# Patient Record
Sex: Female | Born: 2000 | Race: White | Hispanic: No | Marital: Single | Smoking: Never smoker
Health system: Southern US, Community
[De-identification: ages and names within clinical notes are randomized; demographics above are authoritative.]

## PROBLEM LIST (undated history)

## (undated) ENCOUNTER — Emergency Department (HOSPITAL_BASED_OUTPATIENT_CLINIC_OR_DEPARTMENT_OTHER): Admission: EM | Payer: BC Managed Care – PPO

## (undated) DIAGNOSIS — N921 Excessive and frequent menstruation with irregular cycle: Secondary | ICD-10-CM

## (undated) DIAGNOSIS — N939 Abnormal uterine and vaginal bleeding, unspecified: Secondary | ICD-10-CM

## (undated) DIAGNOSIS — F419 Anxiety disorder, unspecified: Secondary | ICD-10-CM

## (undated) DIAGNOSIS — K219 Gastro-esophageal reflux disease without esophagitis: Secondary | ICD-10-CM

## (undated) DIAGNOSIS — G4489 Other headache syndrome: Secondary | ICD-10-CM

## (undated) DIAGNOSIS — M1710 Unilateral primary osteoarthritis, unspecified knee: Secondary | ICD-10-CM

## (undated) DIAGNOSIS — R55 Syncope and collapse: Secondary | ICD-10-CM

## (undated) DIAGNOSIS — L700 Acne vulgaris: Secondary | ICD-10-CM

## (undated) HISTORY — DX: Syncope and collapse: R55

## (undated) HISTORY — DX: Other headache syndrome: G44.89

## (undated) HISTORY — DX: Acne vulgaris: L70.0

## (undated) HISTORY — DX: Abnormal uterine and vaginal bleeding, unspecified: N93.9

## (undated) HISTORY — PX: NO PAST SURGERIES: SHX2092

## (undated) HISTORY — PX: OTHER SURGICAL HISTORY: SHX169

## (undated) HISTORY — DX: Excessive and frequent menstruation with irregular cycle: N92.1

## (undated) HISTORY — DX: Anxiety disorder, unspecified: F41.9

## (undated) HISTORY — PX: TRANSTHORACIC ECHOCARDIOGRAM: SHX275

## (undated) HISTORY — DX: Unilateral primary osteoarthritis, unspecified knee: M17.10

---
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
## 2019-01-16 ENCOUNTER — Ambulatory Visit: Payer: Self-pay | Admitting: Family Medicine

## 2019-01-30 ENCOUNTER — Other Ambulatory Visit: Payer: Self-pay

## 2019-01-30 ENCOUNTER — Ambulatory Visit (INDEPENDENT_AMBULATORY_CARE_PROVIDER_SITE_OTHER): Payer: BC Managed Care – PPO | Admitting: Family Medicine

## 2019-01-30 ENCOUNTER — Encounter: Payer: Self-pay | Admitting: Family Medicine

## 2019-01-30 VITALS — BP 123/83 | HR 97 | Temp 98.7°F | Resp 16 | Ht 66.0 in | Wt 126.6 lb

## 2019-01-30 DIAGNOSIS — L700 Acne vulgaris: Secondary | ICD-10-CM | POA: Diagnosis not present

## 2019-01-30 DIAGNOSIS — N921 Excessive and frequent menstruation with irregular cycle: Secondary | ICD-10-CM | POA: Diagnosis not present

## 2019-01-30 LAB — CBC
HCT: 43.8 % (ref 36.0–46.0)
Hemoglobin: 14.7 g/dL (ref 12.0–15.0)
MCHC: 33.6 g/dL (ref 30.0–36.0)
MCV: 92.9 fl (ref 78.0–100.0)
Platelets: 297 10*3/uL (ref 150.0–400.0)
RBC: 4.72 Mil/uL (ref 3.87–5.11)
RDW: 12.9 % (ref 11.5–14.6)
WBC: 6.2 10*3/uL (ref 4.5–10.5)

## 2019-01-30 LAB — POCT URINE PREGNANCY: Preg Test, Ur: NEGATIVE

## 2019-01-30 MED ORDER — DROSPIRENONE-ETHINYL ESTRADIOL 3-0.02 MG PO TABS: 1.0000 | ORAL_TABLET | Freq: Every day | ORAL | 3 refills | Status: DC

## 2019-01-30 NOTE — Progress Notes (Signed)
Office Note 01/30/2019  CC:  Chief Complaint  Patient presents with  . Establish Care    no previous PCP, discuss birth control   HPI:  Heidi Mccormick is a 18 y.o. female who is here to establish care and discuss abnormal menses. Patient's most recent primary MD: none Old records were not reviewed prior to or during today's visit.  Has irregular menstrual cycles since onset of menses years ago: highly variable intervals between menses.  Typical menstrual period symptoms with each bleeding episode. Uses tampons, describes bleeding as excessive and bad cramping---uses between 1 and 2 boxes of 18 tampons each menses. Takes ibup and uses heating pad.  She has never seen med provider for this in the past. Not sexually active. LMP 01/25/19. Has never been on ocp before. Has derm MD she sees for acne: has tried doxycycline and many topical meds and none have helped (no accutane trial).    Denies hx of thrombosis in 1st deg relatives. She does not smoke.  She does not exercise. She does not take any iron or other MVi or otc meds.  History reviewed. No pertinent past medical history.  History reviewed. No pertinent surgical history.  Family History  Problem Relation Age of Onset  . Miscarriages / [place] Mother   . Diabetes Father   . Heart attack Father   . Heart disease Father   . High Cholesterol Father   . High blood pressure Father   . Asthma Brother   . Diabetes Maternal Grandmother   . Heart disease Maternal Grandmother   . High Cholesterol Maternal Grandmother   . High blood pressure Maternal Grandmother   . Miscarriages / Stillbirths Maternal Grandmother   . Breast cancer Maternal Grandmother   . Cancer Maternal Grandfather   . COPD Maternal Grandfather   . High Cholesterol Maternal Grandfather   . High blood pressure Maternal Grandfather   . Kidney disease Maternal Grandfather   . Early death Paternal Grandfather   . Heart attack Paternal Grandfather   .  Heart disease Paternal Grandfather     Social History   Socioeconomic History  . Marital status: Single    Spouse name: Not on file  . Number of children: Not on file  . Years of education: Not on file  . Highest education level: Not on file  Occupational History  . Not on file  Social Needs  . Financial resource strain: Not on file  . Food insecurity    Worry: Not on file    Inability: Not on file  . Transportation needs    Medical: Not on file    Non-medical: Not on file  Tobacco Use  . Smoking status: Never Smoker  . Smokeless tobacco: Never Used  Substance and Sexual Activity  . Alcohol use: Never    Frequency: Never  . Drug use: Never  . Sexual activity: Never  Lifestyle  . Physical activity    Days per week: Not on file    Minutes per session: Not on file  . Stress: Not on file  Relationships  . Social Herbalist on phone: Not on file    Gets together: Not on file    Attends religious service: Not on file    Active member of club or organization: Not on file    Attends meetings of clubs or organizations: Not on file    Relationship status: Not on file  . Intimate partner violence    Fear of  current or ex partner: Not on file    Emotionally abused: Not on file    Physically abused: Not on file    Forced sexual activity: Not on file  Other Topics Concern  . Not on file  Social History Narrative   Single, no children.   Lives at home with parents as of 01/2019.   Educ: HS--[employer].   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   No T/A/Ds    MEDS: none  No Known Allergies  ROS Review of Systems  Constitutional: Negative for fatigue and fever.  HENT: Negative for congestion and sore throat.   Eyes: Negative for visual disturbance.  Respiratory: Negative for cough.   Cardiovascular: Negative for chest pain.  Gastrointestinal: Negative for abdominal pain and nausea.  Genitourinary: Positive for menstrual problem (see hpi). Negative for  dysuria and enuresis.  Musculoskeletal: Negative for back pain and joint swelling.  Skin: Negative for rash.  Neurological: Negative for weakness and headaches.  Hematological: Negative for adenopathy.    PE; Blood pressure 123/83, pulse 97, temperature 98.7 F (37.1 C), temperature source Temporal, resp. rate 16, height 5\' 6"  (1.676 m), weight 126 lb 9.6 oz (57.4 kg), last menstrual period 01/25/2019, SpO2 100 %. Body mass index is 20.43 kg/m.  Exam chaperoned by Emi HolesBrittanae Staton, CMA. Gen: Alert, well appearing.  Patient is oriented to person, place, time, and situation. AFFECT: pleasant, lucid thought and speech. FACE: some mild comedonal acne on forehad and jaw line.  No white heads or black heads or cysts. WUJ:WJXBENT:Eyes: no injection, icteris, swelling, or exudate.  EOMI, PERRLA. Mouth: lips without lesion/swelling.  Oral mucosa pink and moist. Oropharynx without erythema, exudate, or swelling.  Neck - No masses or thyromegaly or limitation in range of motion CV: RRR, no m/r/g.   LUNGS: CTA bilat, nonlabored resps, good aeration in all lung fields. EXT: no clubbing or cyanosis.  no edema.   Pertinent labs:   UPT today: neg  ASSESSMENT AND PLAN:   1) Metromenorrhagia: started Yaz today. Told pt to expect irregularity to be the norm still for a few cycles until things start to balance out. Checking CBC today to see if heavy menses has led to anemia. UPT neg today.    2) Acne vulgaris: has failed doxy and multiple topical treatments. Accutane was next step but she and her Derm MD felt like hormones would be the next best step. I agree, esp with her irregular and heavy menses.  An After Visit Summary was printed and given to the patient.  Return in about 3 months (around 05/02/2019) for f/u metromenorrhagia.  Signed:  Santiago BumpersPhil Briasia Flinders, MD           01/30/2019

## 2019-02-01 ENCOUNTER — Telehealth: Payer: Self-pay

## 2019-02-01 NOTE — Telephone Encounter (Signed)
MyChart message sent with results.  

## 2020-01-24 ENCOUNTER — Other Ambulatory Visit: Payer: Self-pay

## 2020-01-30 ENCOUNTER — Encounter: Payer: Self-pay | Admitting: Family Medicine

## 2020-03-29 ENCOUNTER — Other Ambulatory Visit: Payer: Self-pay

## 2020-04-04 ENCOUNTER — Other Ambulatory Visit: Payer: Self-pay

## 2020-04-04 ENCOUNTER — Ambulatory Visit (INDEPENDENT_AMBULATORY_CARE_PROVIDER_SITE_OTHER): Payer: BC Managed Care – PPO | Admitting: Family Medicine

## 2020-04-04 ENCOUNTER — Encounter: Payer: Self-pay | Admitting: Family Medicine

## 2020-04-04 VITALS — BP 130/79 | HR 98 | Temp 98.3°F | Resp 16 | Ht 66.75 in | Wt 135.2 lb

## 2020-04-04 DIAGNOSIS — F4010 Social phobia, unspecified: Secondary | ICD-10-CM

## 2020-04-04 DIAGNOSIS — F410 Panic disorder [episodic paroxysmal anxiety] without agoraphobia: Secondary | ICD-10-CM | POA: Diagnosis not present

## 2020-04-04 DIAGNOSIS — F320 Major depressive disorder, single episode, mild: Secondary | ICD-10-CM | POA: Diagnosis not present

## 2020-04-04 DIAGNOSIS — Z23 Encounter for immunization: Secondary | ICD-10-CM

## 2020-04-04 DIAGNOSIS — Z0000 Encounter for general adult medical examination without abnormal findings: Secondary | ICD-10-CM | POA: Diagnosis not present

## 2020-04-04 DIAGNOSIS — F411 Generalized anxiety disorder: Secondary | ICD-10-CM | POA: Diagnosis not present

## 2020-04-04 MED ORDER — FLUOXETINE HCL 20 MG PO CAPS: 20.0000 mg | ORAL_CAPSULE | Freq: Every day | ORAL | 0 refills | Status: DC

## 2020-04-04 MED ORDER — LORAZEPAM 0.5 MG PO TABS: ORAL_TABLET | ORAL | 0 refills | Status: DC

## 2020-04-04 NOTE — Addendum Note (Signed)
Addended by: Emi Holes D on: 04/04/2020 01:42 PM   Modules accepted: Orders

## 2020-04-04 NOTE — Progress Notes (Signed)
Office Note 04/04/2020  CC:  Chief Complaint  Patient presents with  . Annual Exam    pt is not fasting    HPI:  Heidi Mccormick is a 19 y.o. female who is here for annual health maintenannce exam. Dr. Aldona Bar is her GYN MD.  Now working at [employer]. Living at home w/parents.  Most recent switch to progesterone-only OCP has helped SIGNIFICANTly with metromenorrhagia.  Has had long term high anxiety level.  TWo weeks ago had panic attack--upper chest pain and central pressure, waxing and waning, happened 1-2 times a day for a week or two.  Feeling lots of jittering, sweats a lot.  +Palpitations.  PANIC peaks in 10-15 min then grad dissipates.   Sleep is impaired.  Endorses depressed mood for at least 2 wks daily in recent past. No SI or HI.  Poor concentration.  Very irritable.  Energy level poor. Work stress is very bad. Mild tension with parents.   Boyfriend and her not getting along.   No self medicating with alc/drugs. Still functioning/working. Endorces social anxiety and mild agoraphobia. No OCD tendencies.  Past Medical History:  Diagnosis Date  . Abnormal vaginal bleeding    Dr. Aldona Bar  . Acne vulgaris   . Menometrorrhagia    Started Yaz 01/30/19    History reviewed. No pertinent surgical history.  Family History  Problem Relation Age of Onset  . Miscarriages / [place] Mother   . Diabetes Father   . Heart attack Father   . Heart disease Father   . High Cholesterol Father   . High blood pressure Father   . Asthma Brother   . Diabetes Maternal Grandmother   . Heart disease Maternal Grandmother   . High Cholesterol Maternal Grandmother   . High blood pressure Maternal Grandmother   . Miscarriages / Stillbirths Maternal Grandmother   . Breast cancer Maternal Grandmother   . Cancer Maternal Grandfather   . COPD Maternal Grandfather   . High Cholesterol Maternal Grandfather   . High blood pressure Maternal Grandfather   . Kidney disease  Maternal Grandfather   . Early death Paternal Grandfather   . Heart attack Paternal Grandfather   . Heart disease Paternal Grandfather     Social History   Socioeconomic History  . Marital status: Single    Spouse name: Not on file  . Number of children: Not on file  . Years of education: Not on file  . Highest education level: Not on file  Occupational History  . Not on file  Tobacco Use  . Smoking status: Never Smoker  . Smokeless tobacco: Never Used  Substance and Sexual Activity  . Alcohol use: Never  . Drug use: Never  . Sexual activity: Never  Other Topics Concern  . Not on file  Social History Narrative   Single, no children.   Lives at home with parents as of 01/2019.   Educ: HS--[employer].   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   No T/A/Ds   Social Determinants of Health   Financial Resource Strain:   . Difficulty of Paying Living Expenses: Not on file  Food Insecurity:   . Worried About Programme researcher, broadcasting/film/video in the Last Year: Not on file  . Ran Out of Food in the Last Year: Not on file  Transportation Needs:   . Lack of Transportation (Medical): Not on file  . Lack of Transportation (Non-Medical): Not on file  Physical Activity:   . Days  of Exercise per Week: Not on file  . Minutes of Exercise per Session: Not on file  Stress:   . Feeling of Stress : Not on file  Social Connections:   . Frequency of Communication with Friends and Family: Not on file  . Frequency of Social Gatherings with Friends and Family: Not on file  . Attends Religious Services: Not on file  . Active Member of Clubs or Organizations: Not on file  . Attends Banker Meetings: Not on file  . Marital Status: Not on file  Intimate Partner Violence:   . Fear of Current or Ex-Partner: Not on file  . Emotionally Abused: Not on file  . Physically Abused: Not on file  . Sexually Abused: Not on file    Outpatient Medications Prior to Visit  Medication Sig Dispense  Refill  . Drospirenone (SLYND) 4 MG TABS Take 4 mg by mouth daily.    . drospirenone-ethinyl estradiol (YAZ) 3-0.02 MG tablet Take 1 tablet by mouth daily. 3 Package 3  . NORTREL 1/35, 21, tablet Take 1 tablet by mouth daily.     No facility-administered medications prior to visit.    No Known Allergies  ROS Review of Systems  Constitutional: Negative for appetite change, chills, fatigue and fever.  HENT: Negative for congestion, dental problem, ear pain and sore throat.   Eyes: Negative for discharge, redness and visual disturbance.  Respiratory: Negative for cough, chest tightness, shortness of breath and wheezing.   Cardiovascular: Negative for chest pain, palpitations and leg swelling.  Gastrointestinal: Negative for abdominal pain, blood in stool, diarrhea, nausea and vomiting.  Genitourinary: Negative for difficulty urinating, dysuria, flank pain, frequency, hematuria and urgency.  Musculoskeletal: Negative for arthralgias, back pain, joint swelling, myalgias and neck stiffness.  Skin: Negative for pallor and rash.  Neurological: Positive for dizziness and tremors. Negative for speech difficulty, weakness and headaches.  Hematological: Negative for adenopathy. Does not bruise/bleed easily.  Psychiatric/Behavioral: Positive for agitation, decreased concentration and dysphoric mood. Negative for confusion and sleep disturbance. The patient is not nervous/anxious.        Anxiety/panic    PE; Vitals with BMI 04/04/2020 01/30/2019  Height 5' 6.75" 5\' 6"   Weight 135 lbs 3 oz 126 lbs 10 oz  BMI 21.35 20.44  Systolic 130 123  Diastolic 79 83  Pulse 98 97  Exam chaperoned by , CMA.  Gen: Alert, well appearing.  Patient is oriented to person, place, time, and situation. AFFECT: pleasant, lucid thought and speech. ENT: Ears: EACs clear, normal epithelium.  TMs with good light reflex and landmarks bilaterally.  Eyes: no injection, icteris, swelling, or exudate.  EOMI,  PERRLA. Nose: no drainage or turbinate edema/swelling.  No injection or focal lesion.  Mouth: lips without lesion/swelling.  Oral mucosa pink and moist.  Dentition intact and without obvious caries or gingival swelling.  Oropharynx without erythema, exudate, or swelling.  Neck: supple/nontender.  No LAD, mass, or TM.  Carotid pulses 2+ bilaterally, without bruits. CV: RRR, no m/r/g.  Chest wall: mild TTP in LUSB region. LUNGS: CTA bilat, nonlabored resps, good aeration in all lung fields. ABD: soft, NT, ND, BS normal.  No hepatospenomegaly or mass.  No bruits. EXT: no clubbing, cyanosis, or edema.  Musculoskeletal: no joint swelling, erythema, warmth, or tenderness.  ROM of all joints intact. Skin - no sores or suspicious lesions or rashes or color changes Neuro: CN 2-12 intact bilaterally, strength 5/5 in proximal and distal upper extremities and lower extremities  bilaterally.  No sensory deficits.  No tremor.  No disdiadochokinesis.  No ataxia.  Upper extremity and lower extremity DTRs symmetric.  No pronator drift.   Pertinent labs:  No results found for: TSH Lab Results  Component Value Date   WBC 6.2 01/30/2019   HGB 14.7 01/30/2019   HCT 43.8 01/30/2019   MCV 92.9 01/30/2019   PLT 297.0 01/30/2019    ASSESSMENT AND PLAN:   1) GAD, panic attacks, social anxiety d/o, mild agoraphobia: Acute job stress has exacerbated all of this. Start fluoxetine 20mg  qd.  Also ativan 0.5mg , 1-2 bid prn, #30, no RF.  2) Health maintenance exam: Reviewed age and gender appropriate health maintenance issues (prudent diet, regular exercise, health risks of tobacco and excessive alcohol, use of seatbelts, fire alarms in home, use of sunscreen).  Also reviewed age and gender appropriate health screening as well as vaccine recommendations. Vaccines: Tdap->deferred for now.  Flu->given today. Covid->UTD. Labs: none today. Cervical ca screening: she will discuss with dr. .  An After Visit Summary  was printed and given to the patient.  FOLLOW UP:  Return in about 4 weeks (around 05/02/2020) for f/u anx/med.  Signed:  07/02/2020, MD           04/04/2020

## 2020-04-04 NOTE — Patient Instructions (Signed)
Health Maintenance, Female Adopting a healthy lifestyle and getting preventive care are important in promoting health and wellness. Ask your health care provider about:  The right schedule for you to have regular tests and exams.  Things you can do on your own to prevent diseases and keep yourself healthy. What should I know about diet, weight, and exercise? Eat a healthy diet   Eat a diet that includes plenty of vegetables, fruits, low-fat dairy products, and lean protein.  Do not eat a lot of foods that are high in solid fats, added sugars, or sodium. Maintain a healthy weight Body mass index (BMI) is used to identify weight problems. It estimates body fat based on height and weight. Your health care provider can help determine your BMI and help you achieve or maintain a healthy weight. Get regular exercise Get regular exercise. This is one of the most important things you can do for your health. Most adults should:  Exercise for at least 150 minutes each week. The exercise should increase your heart rate and make you sweat (moderate-intensity exercise).  Do strengthening exercises at least twice a week. This is in addition to the moderate-intensity exercise.  Spend less time sitting. Even light physical activity can be beneficial. Watch cholesterol and blood lipids Have your blood tested for lipids and cholesterol at 20 years of age, then have this test every 5 years. Have your cholesterol levels checked more often if:  Your lipid or cholesterol levels are high.  You are older than 21 years of age.  You are at high risk for heart disease. What should I know about cancer screening? Depending on your health history and family history, you may need to have cancer screening at various ages. This may include screening for:  Breast cancer.  Cervical cancer.  Colorectal cancer.  Skin cancer.  Lung cancer. What should I know about heart disease, diabetes, and high blood  pressure? Blood pressure and heart disease  High blood pressure causes heart disease and increases the risk of stroke. This is more likely to develop in people who have high blood pressure readings, are of African descent, or are overweight.  Have your blood pressure checked: ? Every 3-5 years if you are 18-39 years of age. ? Every year if you are 40 years old or older. Diabetes Have regular diabetes screenings. This checks your fasting blood sugar level. Have the screening done:  Once every three years after age 40 if you are at a normal weight and have a low risk for diabetes.  More often and at a younger age if you are overweight or have a high risk for diabetes. What should I know about preventing infection? Hepatitis B If you have a higher risk for hepatitis B, you should be screened for this virus. Talk with your health care provider to find out if you are at risk for hepatitis B infection. Hepatitis C Testing is recommended for:  Everyone born from 1945 through 1965.  Anyone with known risk factors for hepatitis C. Sexually transmitted infections (STIs)  Get screened for STIs, including gonorrhea and chlamydia, if: ? You are sexually active and are younger than 21 years of age. ? You are older than 21 years of age and your health care provider tells you that you are at risk for this type of infection. ? Your sexual activity has changed since you were last screened, and you are at increased risk for chlamydia or gonorrhea. Ask your health care provider if   you are at risk.  Ask your health care provider about whether you are at high risk for HIV. Your health care provider may recommend a prescription medicine to help prevent HIV infection. If you choose to take medicine to prevent HIV, you should first get tested for HIV. You should then be tested every 3 months for as long as you are taking the medicine. Pregnancy  If you are about to stop having your period (premenopausal) and  you may become pregnant, seek counseling before you get pregnant.  Take 400 to 800 micrograms (mcg) of folic acid every day if you become pregnant.  Ask for birth control (contraception) if you want to prevent pregnancy. Osteoporosis and menopause Osteoporosis is a disease in which the bones lose minerals and strength with aging. This can result in bone fractures. If you are 65 years old or older, or if you are at risk for osteoporosis and fractures, ask your health care provider if you should:  Be screened for bone loss.  Take a calcium or vitamin D supplement to lower your risk of fractures.  Be given hormone replacement therapy (HRT) to treat symptoms of menopause. Follow these instructions at home: Lifestyle  Do not use any products that contain nicotine or tobacco, such as cigarettes, e-cigarettes, and chewing tobacco. If you need help quitting, ask your health care provider.  Do not use street drugs.  Do not share needles.  Ask your health care provider for help if you need support or information about quitting drugs. Alcohol use  Do not drink alcohol if: ? Your health care provider tells you not to drink. ? You are pregnant, may be pregnant, or are planning to become pregnant.  If you drink alcohol: ? Limit how much you use to 0-1 drink a day. ? Limit intake if you are breastfeeding.  Be aware of how much alcohol is in your drink. In the [place] one drink equals one 12 oz bottle of beer (355 mL), one 5 oz glass of wine (148 mL), or one 1 oz glass of hard liquor (44 mL). General instructions  Schedule regular health, dental, and eye exams.  Stay current with your vaccines.  Tell your health care provider if: ? You often feel depressed. ? You have ever been abused or do not feel safe at home. Summary  Adopting a healthy lifestyle and getting preventive care are important in promoting health and wellness.  Follow your health care provider's instructions about healthy  diet, exercising, and getting tested or screened for diseases.  Follow your health care provider's instructions on monitoring your cholesterol and blood pressure. This information is not intended to replace advice given to you by your health care provider. Make sure you discuss any questions you have with your health care provider. Document Revised: 07/06/2018 Document Reviewed: 07/06/2018 Elsevier Patient Education  2020 Elsevier Inc.  

## 2020-04-26 ENCOUNTER — Other Ambulatory Visit: Payer: Self-pay | Admitting: Family Medicine

## 2020-05-02 ENCOUNTER — Ambulatory Visit: Payer: BC Managed Care – PPO | Admitting: Family Medicine

## 2020-05-03 ENCOUNTER — Other Ambulatory Visit: Payer: Self-pay | Admitting: Family Medicine

## 2020-05-27 ENCOUNTER — Other Ambulatory Visit: Payer: Self-pay | Admitting: Family Medicine

## 2020-06-03 ENCOUNTER — Other Ambulatory Visit: Payer: Self-pay | Admitting: Family Medicine

## 2020-06-06 ENCOUNTER — Ambulatory Visit (INDEPENDENT_AMBULATORY_CARE_PROVIDER_SITE_OTHER): Payer: BC Managed Care – PPO | Admitting: Family Medicine

## 2020-06-06 ENCOUNTER — Other Ambulatory Visit: Payer: Self-pay

## 2020-06-06 ENCOUNTER — Encounter: Payer: Self-pay | Admitting: Family Medicine

## 2020-06-06 VITALS — BP 107/72 | HR 96 | Temp 98.4°F | Resp 16 | Ht 66.75 in | Wt 134.4 lb

## 2020-06-06 DIAGNOSIS — F410 Panic disorder [episodic paroxysmal anxiety] without agoraphobia: Secondary | ICD-10-CM | POA: Diagnosis not present

## 2020-06-06 DIAGNOSIS — F411 Generalized anxiety disorder: Secondary | ICD-10-CM | POA: Diagnosis not present

## 2020-06-06 MED ORDER — FLUOXETINE HCL 20 MG PO CAPS
ORAL_CAPSULE | ORAL | 1 refills | Status: DC
Start: 2020-06-06 — End: 2021-05-19

## 2020-06-06 NOTE — Progress Notes (Signed)
OFFICE VISIT  06/06/2020  CC:  Chief Complaint  Patient presents with  . Follow-up    anxiety   HPI:    Patient is a 19 y.o.  female who presents for 2 mo f/u anx/dep. A/P as of last visit: "1) GAD, panic attacks, social anxiety d/o, mild agoraphobia: Acute job stress has exacerbated all of this. Start fluoxetine 20mg  qd.  Also ativan 0.5mg , 1-2 bid prn, #30, no RF.  2) Health maintenance exam: Reviewed age and gender appropriate health maintenance issues (prudent diet, regular exercise, health risks of tobacco and excessive alcohol, use of seatbelts, fire alarms in home, use of sunscreen).  Also reviewed age and gender appropriate health screening as well as vaccine recommendations. Vaccines: Tdap->deferred for now.  Flu->given today. Covid->UTD. Labs: none today. Cervical ca screening: she will discuss with dr. ."  INTERIM HX: Anxiety and mood A LOT better.  No med side effects. Sleep improved.  Appetite stable. Family relationships fine.  She ended up breaking up with the boyfriend she had a couple months ago, is happy with this.   Noted imp a couple weeks after starting fluox. Has used loraz only once, for panic attack, says it worked Aldona Bar.   PMP AWARE reviewed today: most recent rx for lorazepam 0.5mg  was filled 04/04/20, # 30, rx by me. No red flags.  Past Medical History:  Diagnosis Date  . Abnormal vaginal bleeding    Dr. 06/04/20  . Acne vulgaris   . Menometrorrhagia    Started Yaz 01/30/19    History reviewed. No pertinent surgical history.  Outpatient Medications Prior to Visit  Medication Sig Dispense Refill  . Drospirenone (SLYND) 4 MG TABS Take 4 mg by mouth daily.    04/02/19 LORazepam (ATIVAN) 0.5 MG tablet 1-2 tabs po bid prn severe anxiety 30 tablet 0  . FLUoxetine (PROZAC) 20 MG capsule TAKE 1 CAPSULE BY MOUTH EVERY DAY 30 capsule 0   No facility-administered medications prior to visit.    No Known Allergies  ROS As per HPI  PE: Vitals with  BMI 06/06/2020 04/04/2020 01/30/2019  Height 5' 6.75" 5' 6.75" 5\' 6"   Weight 134 lbs 6 oz 135 lbs 3 oz 126 lbs 10 oz  BMI 21.22 21.35 20.44  Systolic 107 130 04/02/2019  Diastolic 72 79 83  Pulse 96 98 97   Wt Readings from Last 2 Encounters:  06/06/20 134 lb 6.4 oz (61 kg)  04/04/20 135 lb 3.2 oz (61.3 kg)    Gen: alert, oriented x 4, affect pleasant.  Lucid thinking and conversation noted. HEENT: PERRLA, EOMI.   Neck: no LAD, mass, or thyromegaly. CV: RRR, no m/r/g LUNGS: CTA bilat, nonlabored. NEURO: no tremor or tics noted on observation.  Coordination intact. CN 2-12 grossly intact bilaterally, strength 5/5 in all extremeties.  No ataxia.   LABS:  none  IMPRESSION AND PLAN:  GAD with panic attacks.  She is 80% improved by her estimate. Cont fluox 20mg  qd, #90, RF x 1. Discussed plan of staying on this med for 9-12 mo minimum before considering wean off, but also reasonable to continue indefinitely with approp long term f/u. OK to continue to use lorazepam 0.5mg  prn---she has used it only once in 2 mo.  An After Visit Summary was printed and given to the patient.  FOLLOW UP: Return in about 4 months (around 10/04/2020) for f/u anx/dep.  Signed:  06/04/20, MD           06/06/2020

## 2020-11-25 LAB — HM PAP SMEAR
HM Pap smear: NORMAL
Pap: NEGATIVE

## 2020-11-25 LAB — OB RESULTS CONSOLE GC/CHLAMYDIA: Chlamydia: NEGATIVE

## 2020-11-25 LAB — RESULTS CONSOLE HPV: CHL HPV: NEGATIVE

## 2021-05-05 ENCOUNTER — Encounter: Payer: Self-pay | Admitting: Family Medicine

## 2021-05-05 ENCOUNTER — Ambulatory Visit (INDEPENDENT_AMBULATORY_CARE_PROVIDER_SITE_OTHER): Payer: BC Managed Care – PPO | Admitting: Family Medicine

## 2021-05-05 ENCOUNTER — Other Ambulatory Visit: Payer: Self-pay

## 2021-05-05 VITALS — BP 124/84 | HR 89 | Temp 98.4°F | Resp 16 | Ht 66.0 in | Wt 137.0 lb

## 2021-05-05 DIAGNOSIS — Z23 Encounter for immunization: Secondary | ICD-10-CM | POA: Diagnosis not present

## 2021-05-05 DIAGNOSIS — R55 Syncope and collapse: Secondary | ICD-10-CM | POA: Diagnosis not present

## 2021-05-05 DIAGNOSIS — N912 Amenorrhea, unspecified: Secondary | ICD-10-CM | POA: Diagnosis not present

## 2021-05-05 DIAGNOSIS — R519 Headache, unspecified: Secondary | ICD-10-CM

## 2021-05-05 DIAGNOSIS — Z0000 Encounter for general adult medical examination without abnormal findings: Secondary | ICD-10-CM | POA: Diagnosis not present

## 2021-05-05 DIAGNOSIS — F419 Anxiety disorder, unspecified: Secondary | ICD-10-CM

## 2021-05-05 LAB — POCT URINE PREGNANCY: Preg Test, Ur: NEGATIVE

## 2021-05-05 MED ORDER — RIZATRIPTAN BENZOATE 10 MG PO TABS: 10.0000 mg | ORAL_TABLET | ORAL | 0 refills | Status: DC | PRN

## 2021-05-05 NOTE — Progress Notes (Signed)
Office Note 05/05/2021  CC:  Chief Complaint  Patient presents with   Annual Exam    HPI:  Patient is a 20 y.o. female who is here for annual health maintenance exam and f/u GAD with panic attacks. I last saw her 06/06/20. A/P as of last visit: "GAD with panic attacks.  She is 80% improved by her estimate. Cont fluox 50m qd, #90, RF x 1. Discussed plan of staying on this med for 9-12 mo minimum before considering wean off, but also reasonable to continue indefinitely with approp long term f/u. OK to continue to use lorazepam 0.51mprn---she has used it only once in 2 mo."  INTERIM HX: She reports feeling very well. However, she then goes on to say that she has had 3 episodes of syncope in approximately the last month or 2.  Describes feeling like she starts to get anxious, tunnel vision, muffled hearing and then falls over and some friends usually have caught her before falling to the ground and injuring herself.  He does endorse complete loss of consciousness during these.  Only out very briefly.  1 episode occurred while getting blood, but that she does not note any preceding or triggering event on the others.  No palpitations. Reports a history of similar symptoms over the last few years but they usually are mild and never resulted in syncope and they are infrequent.  Denies vertigo. Also reports about a 3-week history of nearly constant headache.  Intensity waxes and wanes sometimes quite severe.  Location is retro-orbital and across forehead diffusely, only occasionally right retro-orbital only.  She does have occasional mild nausea without vomiting and photophobia.  No aura. Endorses chronic ringing in both ears, without hearing loss.  Says she is not having ringing today, though.  Has not had a menstrual period in approximately 3 to 4 months.  Has long history of irregular menses.  Says that her GYN recently prescribed 2 separate courses of progesterone to try to induce  withdrawal bleeding but these did not result in a bleeding.  She does take daily oral contraceptive but admits not completely regularly.  He is sexually active and has not taken any pregnancy test recently.  Her anxiety has not been an issue lately at all and she weaned herself off of her fluoxetine a couple of months ago.  She felt like these episodes of passing out were basically panic attacks that got real severe, but now she is not so sure.  PMP AWARE reviewed today: most recent rx for lorazepam was filled 04/04/20, # 30, rx by me. No red flags.   Past Medical History:  Diagnosis Date   Abnormal vaginal bleeding    Dr. WeStann Mainland Acne vulgaris    Menometrorrhagia    Started Yaz 01/30/19    History reviewed. No pertinent surgical history.  Family History  Problem Relation Age of Onset   Mi38 Stillbirths Mother    Diabetes Father    Heart attack Father    Heart disease Father    High Cholesterol Father    High blood pressure Father    Asthma Brother    Diabetes Maternal Grandmother    Heart disease Maternal Grandmother    High Cholesterol Maternal Grandmother    High blood pressure Maternal Grandmother    Miscarriages / Stillbirths Maternal Grandmother    Breast cancer Maternal Grandmother    Cancer Maternal Grandfather    COPD Maternal Grandfather    High Cholesterol Maternal Grandfather  High blood pressure Maternal Grandfather    Kidney disease Maternal Grandfather    Early death Paternal Grandfather    Heart attack Paternal Grandfather    Heart disease Paternal Grandfather     Social History   Socioeconomic History   Marital status: Single    Spouse name: Not on file   Number of children: Not on file   Years of education: Not on file   Highest education level: Not on file  Occupational History   Not on file  Tobacco Use   Smoking status: Never   Smokeless tobacco: Never  Substance and Sexual Activity   Alcohol use: Never   Drug use: Never   Sexual  activity: Never  Other Topics Concern   Not on file  Social History Narrative   Single, no children.   Lives at home with parents as of 01/2019.   Educ: HS--[employer].   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   No T/A/Ds   Social Determinants of Health   Financial Resource Strain: Not on file  Food Insecurity: Not on file  Transportation Needs: Not on file  Physical Activity: Not on file  Stress: Not on file  Social Connections: Not on file  Intimate Partner Violence: Not on file    Outpatient Medications Prior to Visit  Medication Sig Dispense Refill   Drospirenone (SLYND) 4 MG TABS Take 4 mg by mouth daily.     LORazepam (ATIVAN) 0.5 MG tablet 1-2 tabs po bid prn severe anxiety 30 tablet 0   FLUoxetine (PROZAC) 20 MG capsule TAKE 1 CAPSULE BY MOUTH EVERY DAY (Patient not taking: Reported on 05/05/2021) 90 capsule 1   No facility-administered medications prior to visit.    No Known Allergies  ROS Review of Systems  Constitutional:  Negative for appetite change, chills, fatigue and fever.  HENT:  Negative for congestion, dental problem, ear pain and sore throat.   Eyes:  Negative for discharge, redness and visual disturbance.  Respiratory:  Negative for cough, chest tightness, shortness of breath and wheezing.   Cardiovascular:  Negative for chest pain, palpitations and leg swelling.  Gastrointestinal:  Negative for abdominal pain, blood in stool, diarrhea, nausea and vomiting.  Endocrine: Negative for polydipsia, polyphagia and polyuria.  Genitourinary:  Negative for difficulty urinating, dysuria, flank pain, frequency, hematuria and urgency.  Musculoskeletal:  Negative for arthralgias, back pain, joint swelling, myalgias and neck stiffness.  Skin:  Negative for pallor and rash.  Neurological:  Positive for syncope and light-headedness. Negative for dizziness, speech difficulty, weakness and headaches.  Hematological:  Negative for adenopathy. Does not bruise/bleed  easily.  Psychiatric/Behavioral:  Negative for confusion and sleep disturbance. The patient is nervous/anxious.    PE; Vitals with BMI 05/05/2021 06/06/2020 04/04/2020  Height '5\' 6"'  5' 6.75" 5' 6.75"  Weight 137 lbs 134 lbs 6 oz 135 lbs 3 oz  BMI 22.12 56.38 93.73  Systolic 428 768 115  Diastolic 84 72 79  Pulse 89 96 98  Gen: Alert, well appearing.  Patient is oriented to person, place, time, and situation. AFFECT: pleasant, lucid thought and speech. ENT: Ears: EACs clear, normal epithelium.  TMs with good light reflex and landmarks bilaterally.  Eyes: no injection, icteris, swelling, or exudate.  EOMI, PERRLA. Nose: no drainage or turbinate edema/swelling.  No injection or focal lesion.  Mouth: lips without lesion/swelling.  Oral mucosa pink and moist.  Dentition intact and without obvious caries or gingival swelling.  Oropharynx without erythema, exudate, or swelling.  Neck: supple/nontender.  No LAD, mass, or TM.  Carotid pulses 2+ bilaterally, without bruits. CV: RRR, no m/r/g.   LUNGS: CTA bilat, nonlabored resps, good aeration in all lung fields. ABD: soft, NT, ND, BS normal.  No hepatospenomegaly or mass.  No bruits. EXT: no clubbing, cyanosis, or edema.  Musculoskeletal: no joint swelling, erythema, warmth, or tenderness.  ROM of all joints intact. Skin - no sores or suspicious lesions or rashes or color changes Neuro: CN 2-12 intact bilaterally, strength 5/5 in proximal and distal upper extremities and lower extremities bilaterally.  No sensory deficits.  No tremor.  No disdiadochokinesis.  No ataxia.  Upper extremity and lower extremity DTRs symmetric.  No pronator drift.   Pertinent labs:   Lab Results  Component Value Date   WBC 6.2 01/30/2019   HGB 14.7 01/30/2019   HCT 43.8 01/30/2019   MCV 92.9 01/30/2019   PLT 297.0 01/30/2019   POC UPT today: NEG  ASSESSMENT AND PLAN:   1) GAD, hx of panic attacks: general anxiety not an issue anymore. Has periods of inc  anxiety ? Panic.  Rare use of lorazepam.  2) syncope.  Sounds vasovagal.  Checking CBC, c-Met, and TSH today.  Given recent problem with headaches we will observe this closely.  3) new onset headaches: We will initiate treatment as migraines at this point. Discussed use of Maxalt 10 mg, may repeat dose in 2 hours if not significant improved.  Discussed possibility of starting daily migraine prevention medication in the near future, as well as possible neurology referral, particularly if things do not improve.  4) amenorrhea, history of irregular menses.  She is working this out with her GYN, but today we did do a pregnancy test and it was negative.  5) Health maintenance exam: Reviewed age and gender appropriate health maintenance issues (prudent diet, regular exercise, health risks of tobacco and excessive alcohol, use of seatbelts, fire alarms in home, use of sunscreen).  Also reviewed age and gender appropriate health screening as well as vaccine recommendations. Vaccines: Flu->given today.  Tdap->UTD. Labs: See #2 above. Cervical ca screening: PAP + HR HPV testing 11/25/20 at [facility] OG/[facility]N-->unknown result--no record available.  An After Visit Summary was printed and given to the patient.  FOLLOW UP:  Return in about 2 weeks (around 05/19/2021) for f/u HAs and syncope.  Signed:  Crissie Sickles, MD           05/05/2021

## 2021-05-06 ENCOUNTER — Encounter: Payer: Self-pay | Admitting: Family Medicine

## 2021-05-06 LAB — CBC WITH DIFFERENTIAL/PLATELET
Basophils Absolute: 0 10*3/uL (ref 0.0–0.1)
Basophils Relative: 0.5 % (ref 0.0–3.0)
Eosinophils Absolute: 0 10*3/uL (ref 0.0–0.7)
Eosinophils Relative: 0.4 % (ref 0.0–5.0)
HCT: 44.3 % (ref 36.0–46.0)
Hemoglobin: 14.6 g/dL (ref 12.0–15.0)
Lymphocytes Relative: 24.3 % (ref 12.0–46.0)
Lymphs Abs: 2.1 10*3/uL (ref 0.7–4.0)
MCHC: 32.9 g/dL (ref 30.0–36.0)
MCV: 91.9 fl (ref 78.0–100.0)
Monocytes Absolute: 0.7 10*3/uL (ref 0.1–1.0)
Monocytes Relative: 8.1 % (ref 3.0–12.0)
Neutro Abs: 5.9 10*3/uL (ref 1.4–7.7)
Neutrophils Relative %: 66.7 % (ref 43.0–77.0)
Platelets: 266 10*3/uL (ref 150.0–400.0)
RBC: 4.82 Mil/uL (ref 3.87–5.11)
RDW: 13.1 % (ref 11.5–15.5)
WBC: 8.8 10*3/uL (ref 4.0–10.5)

## 2021-05-06 LAB — COMPREHENSIVE METABOLIC PANEL
ALT: 13 U/L (ref 0–35)
AST: 18 U/L (ref 0–37)
Albumin: 4.7 g/dL (ref 3.5–5.2)
Alkaline Phosphatase: 64 U/L (ref 39–117)
BUN: 9 mg/dL (ref 6–23)
CO2: 24 mEq/L (ref 19–32)
Calcium: 9.8 mg/dL (ref 8.4–10.5)
Chloride: 102 mEq/L (ref 96–112)
Creatinine, Ser: 0.8 mg/dL (ref 0.40–1.20)
GFR: 104.69 mL/min (ref >60.00)
Glucose, Bld: 93 mg/dL (ref 70–99)
Potassium: 3.5 mEq/L (ref 3.5–5.1)
Sodium: 137 mEq/L (ref 135–145)
Total Bilirubin: 0.5 mg/dL (ref 0.2–1.2)
Total Protein: 7.8 g/dL (ref 6.0–8.3)

## 2021-05-06 LAB — TSH: TSH: 2.51 u[IU]/mL (ref 0.35–5.50)

## 2021-05-07 MED ORDER — RIZATRIPTAN BENZOATE 10 MG PO TABS: 10.0000 mg | ORAL_TABLET | ORAL | 0 refills | Status: DC | PRN

## 2021-05-19 ENCOUNTER — Ambulatory Visit (INDEPENDENT_AMBULATORY_CARE_PROVIDER_SITE_OTHER): Payer: BC Managed Care – PPO | Admitting: Family Medicine

## 2021-05-19 ENCOUNTER — Other Ambulatory Visit: Payer: Self-pay

## 2021-05-19 ENCOUNTER — Encounter: Payer: Self-pay | Admitting: Family Medicine

## 2021-05-19 VITALS — BP 128/84 | HR 95 | Temp 98.1°F | Ht 66.0 in | Wt 139.8 lb

## 2021-05-19 DIAGNOSIS — R519 Headache, unspecified: Secondary | ICD-10-CM

## 2021-05-19 MED ORDER — PROPRANOLOL HCL ER 80 MG PO CP24: 80.0000 mg | ORAL_CAPSULE | Freq: Every day | ORAL | 1 refills | Status: DC

## 2021-05-19 MED ORDER — IBUPROFEN 800 MG PO TABS
800.0000 mg | ORAL_TABLET | Freq: Two times a day (BID) | ORAL | 0 refills | Status: DC | PRN
  Filled 2021-09-23: qty 30, 15d supply, fill #0

## 2021-05-19 NOTE — Progress Notes (Signed)
OFFICE VISIT  05/19/2021  CC:  Chief Complaint  Patient presents with   Follow-up    Headaches, still taking meds prescribed but are not working well.     HPI:    Patient is a 20 y.o. female who presents for 2 wk f/u anxiety and syncope. A/P as of last visit: "1) GAD, hx of panic attacks: general anxiety not an issue anymore. Has periods of inc anxiety ? Panic.  Rare use of lorazepam.   2) syncope.  Sounds vasovagal.  Checking CBC, c-Met, and TSH today.  Given recent problem with headaches we will observe this closely.   3) new onset headaches: We will initiate treatment as migraines at this point. Discussed use of Maxalt 10 mg, may repeat dose in 2 hours if not significant improved.  Discussed possibility of starting daily migraine prevention medication in the near future, as well as possible neurology referral, particularly if things do not improve.   4) amenorrhea, history of irregular menses.  She is working this out with her GYN, but today we did do a pregnancy test and it was negative.   5) Health maintenance exam: Reviewed age and gender appropriate health maintenance issues (prudent diet, regular exercise, health risks of tobacco and excessive alcohol, use of seatbelts, fire alarms in home, use of sunscreen).  Also reviewed age and gender appropriate health screening as well as vaccine recommendations. Vaccines: Flu->given today.  Tdap->UTD. Labs: See #2 above. Cervical ca screening: PAP + HR HPV testing 11/25/20 at [facility]-->unknown result--no record available."  INTERIM HX: Heidi Mccormick reports having a chronic mild intensity headache since I saw her last, with breakthrough of severe retro-orbital headache on a pretty regular basis, nearly daily.  She has some nausea, photophobia, and phonophobia with these. Denies any double vision, loss of balance, loss of bowel or bladder control, or syncope. No dizziness or presyncope.  She has used the rizatriptan several times and  only on one occasion noted some improvement after taking a second dose.  No headaches that awaken her in the night.  No clear trigger for headache.  She has rarely used the lorazepam prn severe anxiety.  ROS as above, plus--> no fevers, no CP, no SOB, no wheezing, no cough, no rashes, no melena/hematochezia.  No polyuria or polydipsia.  No myalgias or arthralgias.  No focal weakness, paresthesias, or tremors.  No acute vision or hearing abnormalities.  No dysuria or unusual/new urinary urgency or frequency.  No recent changes in lower legs. No n/v/d or abd pain.  No palpitations.    Past Medical History:  Diagnosis Date   Abnormal vaginal bleeding    Dr. Stann Mainland   Acne vulgaris    Menometrorrhagia    Started Yaz 01/30/19   History reviewed. No pertinent surgical history.  NOTED: NOT taking fluoxetine listed below Outpatient Medications Prior to Visit  Medication Sig Dispense Refill   Drospirenone (SLYND) 4 MG TABS Take 4 mg by mouth daily.     LORazepam (ATIVAN) 0.5 MG tablet 1-2 tabs po bid prn severe anxiety 30 tablet 0   rizatriptan (MAXALT) 10 MG tablet Take 1 tablet (10 mg total) by mouth as needed for migraine. May repeat in 2 hours if needed 10 tablet 0   FLUoxetine (PROZAC) 20 MG capsule TAKE 1 CAPSULE BY MOUTH EVERY DAY (Patient not taking: No sig reported) 90 capsule 1   No facility-administered medications prior to visit.    No Known Allergies  ROS As per HPI  PE:  Vitals with BMI 05/19/2021 05/05/2021 06/06/2020  Height _0  _1  5' 6.75"  Weight 139 lbs 13 oz 137 lbs 134 lbs 6 oz  BMI 22.58 37.48 27.07  Systolic 867 544 920  Diastolic 84 84 72  Pulse 95 89 96   Gen: Alert, well appearing.  Patient is oriented to person, place, time, and situation. AFFECT: pleasant, lucid thought and speech. Neuro: CN 2-12 intact bilaterally, strength 5/5 in proximal and distal upper extremities and lower extremities bilaterally.  No sensory deficits.  No tremor.  No  disdiadochokinesis.  No ataxia.  Upper extremity and lower extremity DTRs symmetric.  No pronator drift.   LABS:  Lab Results  Component Value Date   TSH 2.51 05/05/2021   Lab Results  Component Value Date   WBC 8.8 05/05/2021   HGB 14.6 05/05/2021   HCT 44.3 05/05/2021   MCV 91.9 05/05/2021   PLT 266.0 05/05/2021   Lab Results  Component Value Date   CREATININE 0.80 05/05/2021   BUN 9 05/05/2021   NA 137 05/05/2021   K 3.5 05/05/2021   CL 102 05/05/2021   CO2 24 05/05/2021   Lab Results  Component Value Date   ALT 13 05/05/2021   AST 18 05/05/2021   ALKPHOS 64 05/05/2021   BILITOT 0.5 05/05/2021   IMPRESSION AND PLAN:  New onset headache syndrome: Describes combined type of headache (tension and migraine).   Poor response to rizatriptan as abortive.  The headaches are not progressing in frequency or intensity. Will start InnoPran XL 80 mg nightly and we will ask neurology for help. In the meantime we will try ibuprofen 800 mg twice a day as needed for headache.  She will also periodically retry rizatriptan.  An After Visit Summary was printed and given to the patient.  FOLLOW UP: Return in about 2 weeks (around 06/02/2021) for f/u headaches.  Signed:  Crissie Sickles, MD           05/19/2021

## 2021-05-27 DIAGNOSIS — Z8701 Personal history of pneumonia (recurrent): Secondary | ICD-10-CM

## 2021-05-27 HISTORY — DX: Personal history of pneumonia (recurrent): Z87.01

## 2021-05-29 ENCOUNTER — Encounter: Payer: Self-pay | Admitting: Psychiatry

## 2021-05-29 ENCOUNTER — Ambulatory Visit (INDEPENDENT_AMBULATORY_CARE_PROVIDER_SITE_OTHER): Payer: BC Managed Care – PPO | Admitting: Psychiatry

## 2021-05-29 VITALS — BP 108/76 | HR 64 | Ht 66.0 in | Wt 142.2 lb

## 2021-05-29 DIAGNOSIS — R55 Syncope and collapse: Secondary | ICD-10-CM | POA: Diagnosis not present

## 2021-05-29 DIAGNOSIS — G43909 Migraine, unspecified, not intractable, without status migrainosus: Secondary | ICD-10-CM | POA: Insufficient documentation

## 2021-05-29 DIAGNOSIS — R51 Headache with orthostatic component, not elsewhere classified: Secondary | ICD-10-CM

## 2021-05-29 DIAGNOSIS — G43019 Migraine without aura, intractable, without status migrainosus: Secondary | ICD-10-CM | POA: Diagnosis not present

## 2021-05-29 MED ORDER — ELETRIPTAN HYDROBROMIDE 40 MG PO TABS: 40.0000 mg | ORAL_TABLET | ORAL | 0 refills | Status: DC | PRN

## 2021-05-29 MED ORDER — METHYLPREDNISOLONE 4 MG PO TBPK: ORAL_TABLET | ORAL | 0 refills | Status: DC

## 2021-05-29 NOTE — Patient Instructions (Addendum)
Start eletriptan 40 mg as needed for migraines. Take at the onset of migraine. If headache recurs or does not fully resolve, you may take a second dose after 2 hours. Please avoid taking more than 2 days per week or 10 days per month. Steroid pack to help break current headache Continue propranolol 80 mg daily for now. It can take up to 8 weeks for the medication to build in your system before you feel the full effect of the medication MRI brain

## 2021-05-29 NOTE — Progress Notes (Signed)
Referring:  Jeoffrey Massed, MD [address] [place],  [place] [address]  PCP: Jeoffrey Massed, MD  Neurology was asked to evaluate Heidi Mccormick, a 20 year old female for a chief complaint of headaches.  Our recommendations of care will be communicated by shared medical record.    CC:  headaches  HPI:  Medical co-morbidities: anxiety, panic attacks  The patient presents for evaluation of daily headaches which began 2 months ago. She did not have headaches before this. No clear inciting trigger for her headaches. Essentially woke up one day and had daily headaches ever since. Headaches are intermittent but occur multiple times per day. They become full blown migraines 2-3 times per week which last up to 3 hours at a time. Headaches are worse with yawning and lying down. Vision will sometimes go in and out of focus. She has also had multiple syncopal episodes in the past couple of months.   She juststarted propranolol 1 week ago and has not had a migraine since then. Also tried Maxalt for rescue. Did not have any change with first dose and had some relief with repeat dose. Takes tylenol or ibuprofen 2-3 times per week for rescue which doesn't help.  Headache History: Onset: 2 months ago Triggers: yawning, lying down for bed  Most common time of day for headache to begin: any time, migraines more common before bedtime Aura: no Location: retro-orbital Quality/Description: pressure, throbbing Severity: 2/10 daily headache, 9/10 migraines Associated Symptoms:  Photophobia: yes  Phonophobia: no  Nausea: yes Worse with activity?: yes Duration of headaches: 3 hours  Headache days per month: 30 Headache free days per month: 0  Current Treatment: Abortive Maxalt 10 mg PRN  Preventative Propranolol 80 mg daily  Prior Therapies                                 Fluoxetine Propranolol 80 mg daily   Headache Risk Factors: Headache risk factors and/or co-morbidities (-)  Neck Pain (-) History of Motor Vehicle Accident (-) Sleep Disorder (-) Obesity  Body mass index is 22.95 kg/m. (-) History of Traumatic Brain Injury and/or Concussion (+) History of Syncope  LABS: CBC, CMP, TSH wnl  IMAGING:  N/A  Current Outpatient Medications on File Prior to Visit  Medication Sig Dispense Refill   Drospirenone (SLYND) 4 MG TABS Take 4 mg by mouth daily.     ibuprofen (ADVIL) 800 MG tablet 1 tab po bid as needed for headache 30 tablet 1   LORazepam (ATIVAN) 0.5 MG tablet 1-2 tabs po bid prn severe anxiety 30 tablet 0   propranolol ER (INDERAL LA) 80 MG 24 hr capsule Take 1 capsule (80 mg total) by mouth daily. 30 capsule 1   rizatriptan (MAXALT) 10 MG tablet Take 1 tablet (10 mg total) by mouth as needed for migraine. May repeat in 2 hours if needed 10 tablet 0   No current facility-administered medications on file prior to visit.     Allergies: No Known Allergies  Family History: Migraine or other headaches in the family:  cousin Aneurysms in a first degree relative:  no Brain tumors in the family:  no Other neurological illness in the family:   no  Past Medical History: Past Medical History:  Diagnosis Date   Abnormal vaginal bleeding    Dr. Aldona Bar   Acne vulgaris    Headache syndrome    summer  2022   Menometrorrhagia    Started Yaz 01/30/19   Vasovagal syncope    summer 2022    Past Surgical History No past surgical history on file.  Social History: Social History   Tobacco Use   Smoking status: Never   Smokeless tobacco: Never  Substance Use Topics   Alcohol use: Never   Drug use: Never    ROS: Negative for fevers, chills. Positive for headaches, syncope. All other systems reviewed and negative unless stated otherwise in HPI.   Physical Exam:   Vital Signs: BP 108/76   Pulse 64   Ht 5\' 6"  (1.676 m)   Wt 142 lb 3.2 oz (64.5 kg)   BMI 22.95 kg/m  GENERAL: well appearing,in no acute distress,alert SKIN:  Color, texture,  turgor normal. No rashes or lesions HEAD:  Normocephalic/atraumatic. CV:  RRR RESP: Normal respiratory effort MSK: no tenderness to palpation over occiput, neck, or shoulders  NEUROLOGICAL: Mental Status: Alert, oriented to person, place and time,Follows commands Cranial Nerves: PERRL, no papilledema visualized, visual fields intact to confrontation,extraocular movements intact,facial sensation intact,no facial droop or ptosis,hearing intact to finger rub bilaterally,no dysarthria,palate elevate symmetrically,tongue protrudes midline,shoulder shrug intact and symmetric Motor: muscle strength 5/5 both upper and lower extremities,no drift, normal tone Reflexes: 2+ throughout Sensation: intact to light touch all 4 extremities Coordination: Finger-to- nose-finger intact bilaterally,Heel-to-shin intact bilaterally Gait: normal-based   IMPRESSION: 20 year old female with a history of anxiety/panic attacks who presents for evaluation of daily headaches which began 2 months ago. Will order MRI brain to assess for underlying structural causes of new daily headaches and syncope. Steroid taper provided to help break her current headache cycle. She just started propranolol 1 week ago, would continue this for 2-3 months to see if it decreased her headache frequency. Will switch Maxalt to Relpax as propranolol can increase Maxalt's side effects.  PLAN: -MRI brain with contrast -Medrol dosepak to break current headache cycle -Prevention: Continue propranolol 80 mg daily -Rescue: Start Relpax 40 mg PRN   I spent a total of 33 minutes chart reviewing and counseling the patient. Headache education was done. Discussed treatment options including preventive and acute medications. Discussed medication overuse headache and to limit use of acute treatments to no more than 2 days/week or 10 days/month. Discussed medication side effects, adverse reactions and drug interactions. Written educational materials and  patient instructions outlining all of the above were given.  Follow-up: 3 months   19, MD 05/29/2021   9:49 AM

## 2021-06-02 ENCOUNTER — Other Ambulatory Visit: Payer: Self-pay

## 2021-06-02 ENCOUNTER — Encounter: Payer: BC Managed Care – PPO | Admitting: Family Medicine

## 2021-06-02 ENCOUNTER — Encounter: Payer: Self-pay | Admitting: Family Medicine

## 2021-06-02 NOTE — Progress Notes (Addendum)
OFFICE VISIT   06/02/2021   CC: Migraines and Tension headaches     HPI:     Patient is a 20 y.o. female who presents for 2 wk f/u HA syndrome. She has a two month history of tension and migraine headaches and was prescribed propranolol at the last visit. Since then she has only had one migraine, but continues to have frequent tension headaches throughout the day. It is not known what triggers the headaches. Ibuprofen does seem to provide some relief to the headaches, but sometimes they can still persist. On a pain scale, she rates them at a 2/10 and they do not hinder her from functioning throughout the day. ROS negative (changes in vision, headaches waking her up from sleep, increasing severity).  Interim Hx:  Patient had her initial consult with a neurologist on 11/3 and was prescribed the following medications Eletriptan - twice a week if migraine persist Methylprednisolone - to disrupt cycle of headaches Patient will be contacted shortly for follow up MRI.       Past Medical History:  Diagnosis Date   Abnormal vaginal bleeding      Dr. Aldona Bar   Acne vulgaris     Headache syndrome      summer 2022   Menometrorrhagia      Started Yaz 01/30/19   Vasovagal syncope      summer 2022      No past surgical history on file.         Outpatient Medications Prior to Visit  Medication Sig Dispense Refill   Drospirenone (SLYND) 4 MG TABS Take 4 mg by mouth daily.       eletriptan (RELPAX) 40 MG tablet Take 1 tablet (40 mg total) by mouth as needed for migraine or headache. May repeat in 2 hours if headache persists or recurs. 10 tablet 0   ibuprofen (ADVIL) 800 MG tablet 1 tab po bid as needed for headache 30 tablet 1   LORazepam (ATIVAN) 0.5 MG tablet 1-2 tabs po bid prn severe anxiety 30 tablet 0   methylPREDNISolone (MEDROL DOSEPAK) 4 MG TBPK tablet Take as directed by packaging 1 each 0   propranolol ER (INDERAL LA) 80 MG 24 hr capsule Take 1 capsule (80 mg total) by mouth daily. 30  capsule 1    No facility-administered medications prior to visit.      No Known Allergies   ROS As per HPI   PE: Vitals with BMI 05/29/2021 05/19/2021 05/05/2021  Height 5\' 6"  5\' 6"  5\' 6"   Weight 142 lbs 3 oz 139 lbs 13 oz 137 lbs  BMI 22.96 22.58 22.12  Systolic 108 128  Diastolic 76 84 84  Pulse 64 95 89     Physical Exam Constitutional:      Appearance: She is well-developed.  Eyes:     General: No visual field deficit.    Extraocular Movements: Extraocular movements intact.  Pulmonary:     Effort: Pulmonary effort is normal.  Neurological:     Mental Status: She is alert.     Cranial Nerves: No cranial nerve deficit.     Sensory: No sensory deficit.     Motor: No weakness.     Coordination: Romberg sign negative. Coordination normal.     Gait: Gait normal.  Psychiatric:        Mood and Affect: Mood normal.        Behavior: Behavior normal.   LABS:  Recent Labs  Lab Results  Component Value Date    TSH 2.51 05/05/2021      Recent Labs       Lab Results  Component Value Date    WBC 8.8 05/05/2021    HGB 14.6 05/05/2021    HCT 44.3 05/05/2021    MCV 91.9 05/05/2021    PLT 266.0 05/05/2021      Recent Labs       Lab Results  Component Value Date    CREATININE 0.80 05/05/2021    BUN 9 05/05/2021    NA 137 05/05/2021    K 3.5 05/05/2021    CL 102 05/05/2021    CO2 24 05/05/2021      Recent Labs       Lab Results  Component Value Date    ALT 13 05/05/2021    AST 18 05/05/2021    ALKPHOS 64 05/05/2021    BILITOT 0.5 05/05/2021        IMPRESSION AND PLAN:   New HA syndrome->tension and migraine types.  Patient is in improved condition.  Will continue Ibuprofen 800mg  as needed for tension headaches (she basically limits this to max of 1 tab per day) and prophylactic Propranolol for migraines. Patient does not report and side effects of the medications. Will also continue to follow the neurologist recommendation with prescriptions  for Eletriptan and Prednisolone. MRI will be scheduled soon and we will reassess after some potential intracranial causes are ruled out.  Plan to follow up in 6 weeks. Next neuro f/u is in 3 mo per pt report today.    An After Visit Summary was printed and given to the patient.   FOLLOW UP: 6 wks  -MS3   I personally was present during the history, physical exam, and medical decision-making activities of this service and have verified that the service and findings are accurately documented in the student's note.  Signed:  Clarita Crane, MD           06/02/2021

## 2021-06-02 NOTE — Progress Notes (Signed)
OFFICE VISIT  06/02/2021  CC: No chief complaint on file.   HPI:    Patient is a 20 y.o. female who presents for 2 wk f/u HA syndrome. A/P as of last visit: "New onset headache syndrome: Describes combined type of headache (tension and migraine).   Poor response to rizatriptan as abortive.  The headaches are not progressing in frequency or intensity. Will start InnoPran XL 80 mg nightly and we will ask neurology for help. In the meantime we will try ibuprofen 800 mg twice a day as needed for headache.  She will also periodically retry rizatriptan."  INTERIM HX:   PMP AWARE reviewed today: most recent rx for lorazepam was filled 04/04/20, # 30, rx by me. No red flags.   Past Medical History:  Diagnosis Date   Abnormal vaginal bleeding    Dr. Aldona Bar   Acne vulgaris    Headache syndrome    summer 2022   Menometrorrhagia    Started Yaz 01/30/19   Vasovagal syncope    summer 2022    No past surgical history on file.  Outpatient Medications Prior to Visit  Medication Sig Dispense Refill   Drospirenone (SLYND) 4 MG TABS Take 4 mg by mouth daily.     eletriptan (RELPAX) 40 MG tablet Take 1 tablet (40 mg total) by mouth as needed for migraine or headache. May repeat in 2 hours if headache persists or recurs. 10 tablet 0   ibuprofen (ADVIL) 800 MG tablet 1 tab po bid as needed for headache 30 tablet 1   LORazepam (ATIVAN) 0.5 MG tablet 1-2 tabs po bid prn severe anxiety 30 tablet 0   methylPREDNISolone (MEDROL DOSEPAK) 4 MG TBPK tablet Take as directed by packaging 1 each 0   propranolol ER (INDERAL LA) 80 MG 24 hr capsule Take 1 capsule (80 mg total) by mouth daily. 30 capsule 1   No facility-administered medications prior to visit.    No Known Allergies  ROS As per HPI  PE: Vitals with BMI 05/29/2021 05/19/2021 05/05/2021  Height 5\' 6"  5\' 6"  5\' 6"   Weight 142 lbs 3 oz 139 lbs 13 oz 137 lbs  BMI 22.96 22.58 22.12  Systolic 108 128  Diastolic 76 84 84  Pulse 64 95 89      LABS:  Lab Results  Component Value Date   TSH 2.51 05/05/2021   Lab Results  Component Value Date   WBC 8.8 05/05/2021   HGB 14.6 05/05/2021   HCT 44.3 05/05/2021   MCV 91.9 05/05/2021   PLT 266.0 05/05/2021   Lab Results  Component Value Date   CREATININE 0.80 05/05/2021   BUN 9 05/05/2021   NA 137 05/05/2021   K 3.5 05/05/2021   CL 102 05/05/2021   CO2 24 05/05/2021   Lab Results  Component Value Date   ALT 13 05/05/2021   AST 18 05/05/2021   ALKPHOS 64 05/05/2021   BILITOT 0.5 05/05/2021    IMPRESSION AND PLAN:  No problem-specific Assessment & Plan notes found for this encounter.  No labs needed  An After Visit Summary was printed and given to the patient.  FOLLOW UP: No follow-ups on file.  Signed:  07/05/2021, MD           06/02/2021

## 2021-06-04 ENCOUNTER — Telehealth: Payer: Self-pay | Admitting: Psychiatry

## 2021-06-04 NOTE — Telephone Encounter (Signed)
LVM for pt to schedule  [employer] auth: [id number] (exp. 06/02/21 to 07/31/21)

## 2021-06-10 ENCOUNTER — Other Ambulatory Visit: Payer: Self-pay | Admitting: Family Medicine

## 2021-06-12 ENCOUNTER — Other Ambulatory Visit: Payer: Self-pay

## 2021-06-12 ENCOUNTER — Encounter (HOSPITAL_BASED_OUTPATIENT_CLINIC_OR_DEPARTMENT_OTHER): Payer: Self-pay

## 2021-06-12 ENCOUNTER — Emergency Department (HOSPITAL_BASED_OUTPATIENT_CLINIC_OR_DEPARTMENT_OTHER)
Admission: EM | Admit: 2021-06-12 | Discharge: 2021-06-12 | Disposition: A | Discharge status: Home / Self Care | Payer: Worker's Compensation | Attending: Emergency Medicine | Admitting: Emergency Medicine

## 2021-06-12 DIAGNOSIS — Z23 Encounter for immunization: Secondary | ICD-10-CM | POA: Diagnosis not present

## 2021-06-12 DIAGNOSIS — Z2914 Encounter for prophylactic rabies immune globin: Secondary | ICD-10-CM | POA: Insufficient documentation

## 2021-06-12 DIAGNOSIS — Z203 Contact with and (suspected) exposure to rabies: Secondary | ICD-10-CM | POA: Insufficient documentation

## 2021-06-12 DIAGNOSIS — W540XXA Bitten by dog, initial encounter: Secondary | ICD-10-CM | POA: Insufficient documentation

## 2021-06-12 DIAGNOSIS — S0990XA Unspecified injury of head, initial encounter: Secondary | ICD-10-CM | POA: Diagnosis present

## 2021-06-12 DIAGNOSIS — Y990 Civilian activity done for income or pay: Secondary | ICD-10-CM | POA: Insufficient documentation

## 2021-06-12 DIAGNOSIS — S0185XA Open bite of other part of head, initial encounter: Secondary | ICD-10-CM | POA: Insufficient documentation

## 2021-06-12 MED ORDER — AMOXICILLIN-POT CLAVULANATE 875-125 MG PO TABS: 1.0000 | ORAL_TABLET | Freq: Two times a day (BID) | ORAL | 0 refills | Status: DC

## 2021-06-12 MED ORDER — AMOXICILLIN-POT CLAVULANATE 875-125 MG PO TABS
1.0000 | ORAL_TABLET | Freq: Once | ORAL | Status: AC
  Administered 2021-06-12: 21:00:00 1 via ORAL
  Filled 2021-06-12: qty 1

## 2021-06-12 MED ORDER — RABIES IMMUNE GLOBULIN 150 UNIT/ML IM INJ
20.0000 [IU]/kg | INJECTION | Freq: Once | INTRAMUSCULAR | Status: AC
  Administered 2021-06-12: 1200 [IU] via INTRAMUSCULAR
  Filled 2021-06-12: qty 6

## 2021-06-12 MED ORDER — RABIES VACCINE, PCEC IM SUSR
1.0000 mL | Freq: Once | INTRAMUSCULAR | Status: AC
  Administered 2021-06-12: 21:00:00 1 mL via INTRAMUSCULAR
  Filled 2021-06-12: qty 1

## 2021-06-12 NOTE — ED Provider Notes (Signed)
[facility] EMERGENCY DEPT Provider Note   CSN: [id number] Arrival date & time: 06/12/21  [id number]     History Chief Complaint  Patient presents with   Animal Bite    Heidi Mccormick is a 20 y.o. otherwise healthy female to the emergency department for evaluation after a dog bite to the face prior to arrival.  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  She reports her tetanus is up-to-date.  Denies any previous rabies vaccination.  Denies any medical history.  Denies any surgical history.  Daily medications include propranolol and OCP.  No known drug allergies.  Denies any tobacco, EtOH, or drug use.   Animal Bite Associated symptoms: no fever and no rash       Past Medical History:  Diagnosis Date   Abnormal vaginal bleeding    Dr. Stann Mainland   Acne vulgaris    Headache syndrome    summer 2022   Menometrorrhagia    Started Yaz 01/30/19   Vasovagal syncope    summer 2022    Patient Active Problem List   Diagnosis Date Noted   Migraine 05/29/2021    History reviewed. No pertinent surgical history.   OB History     Gravida  0   Para  0   Term  0   Preterm  0   AB  0   Living  0      SAB  0   IAB  0   Ectopic  0   Multiple  0   Live Births  0           Family History  Problem Relation Age of Onset   48 / Stillbirths Mother    Diabetes Father    Heart attack Father    Heart disease Father    High Cholesterol Father    High blood pressure Father    Asthma Brother    Diabetes Maternal Grandmother    Heart disease Maternal Grandmother    High Cholesterol Maternal Grandmother    High blood pressure Maternal Grandmother    Miscarriages / Stillbirths Maternal Grandmother    Breast cancer Maternal Grandmother    Cancer Maternal Grandfather    COPD Maternal Grandfather    High Cholesterol Maternal  Grandfather    High blood pressure Maternal Grandfather    Kidney disease Maternal Grandfather    Early death Paternal Grandfather    Heart attack Paternal Grandfather    Heart disease Paternal Grandfather     Social History   Tobacco Use   Smoking status: Never   Smokeless tobacco: Never  Substance Use Topics   Alcohol use: Never   Drug use: Never    Home Medications Prior to Admission medications   Medication Sig Start Date End Date Taking? Authorizing Provider  amoxicillin-clavulanate (AUGMENTIN) 875-125 MG tablet Take 1 tablet by mouth every 12 (twelve) hours. 06/12/21  Yes Sherrell Puller, PA-C  Drospirenone (SLYND) 4 MG TABS Take 4 mg by mouth daily.    [provider]  eletriptan (RELPAX) 40 MG tablet Take 1 tablet (40 mg total) by mouth as needed for migraine or headache. May repeat in 2 hours if headache persists or recurs. 05/29/21   Genia Harold, MD  ibuprofen (ADVIL) 800 MG tablet 1 tab po bid as needed for headache 05/19/21   McGowen, Adrian Blackwater, MD  LORazepam (ATIVAN) 0.5  MG tablet 1-2 tabs po bid prn severe anxiety 04/04/20   McGowen, Maryjean Morn, MD  methylPREDNISolone (MEDROL DOSEPAK) 4 MG TBPK tablet Take as directed by packaging 05/29/21   Ocie Doyne, MD  propranolol ER (INDERAL LA) 80 MG 24 hr capsule Take 1 capsule (80 mg total) by mouth daily. 05/19/21   McGowen, Maryjean Morn, MD    Allergies    Patient has no known allergies.  Review of Systems   Review of Systems  Constitutional:  Negative for chills and fever.  HENT:  Negative for ear pain and sore throat.   Eyes:  Negative for pain and visual disturbance.  Respiratory:  Negative for cough and shortness of breath.   Cardiovascular:  Negative for chest pain and palpitations.  Gastrointestinal:  Negative for abdominal pain and vomiting.  Genitourinary:  Negative for dysuria and hematuria.  Musculoskeletal:  Negative for arthralgias and back pain.  Skin:  Positive for wound. Negative for color change  and rash.  Neurological:  Negative for seizures and syncope.  All other systems reviewed and are negative.  Physical Exam Updated Vital Signs BP (!) 126/95 (BP Location: Right Arm)   Pulse 78   Temp 98.7 F (37.1 C)   Resp 16   Ht 5\' 6"  (1.676 m)   Wt 61.7 kg   LMP  (LMP Unknown)   SpO2 98%   BMI 21.95 kg/m   Physical Exam Vitals and nursing note reviewed.  Constitutional:      General: She is not in acute distress.    Appearance: Normal appearance. She is not toxic-appearing.  HENT:     Nose:     Comments: Superficial puncture wound to the bridge of the nose.  In her right nostril, she has a superficial abrasion at the very tip.  Please see pictures for more information. Eyes:     General: No scleral icterus. Pulmonary:     Effort: Pulmonary effort is normal. No respiratory distress.  Skin:    General: Skin is dry.     Findings: No rash.  Neurological:     General: No focal deficit present.     Mental Status: She is alert. Mental status is at baseline.  Psychiatric:        Mood and Affect: Mood normal.      ED Results / Procedures / Treatments   Labs (all labs ordered are listed, but only abnormal results are displayed) Labs Reviewed - No data to display  EKG None  Radiology No results found.  Procedures Procedures   Medications Ordered in ED Medications  amoxicillin-clavulanate (AUGMENTIN) 875-125 MG per tablet 1 tablet (has no administration in time range)  rabies vaccine (RABAVERT) injection 1 mL (1 mL Intramuscular Given 06/12/21 2047)  rabies immune globulin (HYPERAB/KEDRAB) injection 1,200 Units (1,200 Units Intramuscular Given 06/12/21 2049)    ED Course  I have reviewed the triage vital signs and the nursing notes.  Pertinent labs & imaging results that were available during my care of the patient were reviewed by me and considered in my medical decision making (see chart for details).  20 year old female presents emergency department for  rabies vaccination evaluation after dog bite.  Patient's wound superficial and not needing wound or laceration repair.  Will place patient on Augmentin. First dose given in the ED. The vaccination status of the dog is unknown and patient and patient's workplace is requesting rabies vaccination.  Rabies vaccination ordered.  Discussed the patient the scheduling needed for rabies vaccination.  Return precautions discussed with patient.  Patient agrees with plan.  Patient is stable being discharged home in good condition.    MDM Rules/Calculators/A&P                          Final Clinical Impression(s) / ED Diagnoses Final diagnoses:  Dog bite, initial encounter  Need for rabies vaccination    Rx / DC Orders ED Discharge Orders          Ordered    amoxicillin-clavulanate (AUGMENTIN) 875-125 MG tablet  Every 12 hours        06/12/21 2121             Sherrell Puller, PA-C 06/12/21 2128    Wyvonnia Dusky, MD 06/13/21 [phone]

## 2021-06-12 NOTE — ED Triage Notes (Signed)
Patient here POV from Home with Dog Bite.   Patient works at [employer] and was bit by [employer] on [address]. Puncture Mark on Nose. No Bleeding.  No Status on Dog regarding Vaccination Status. NAD Noted during Triage. A&Ox4. GCS 15. Ambulatory.

## 2021-06-12 NOTE — Discharge Instructions (Addendum)
He was seen here today for evaluation after dog bite.  You have been placed on Augmentin, an antibiotic, to prevent infection.  These take antibiotic as prescribed and complete it to prevent infection.  Additionally, you have been started on the rabies vaccination.  Additional information on the rabies vaccination and the additional vaccines required attached to this discharge paperwork.  You received your first vaccine today and will require another vaccination on day 3 with this being day 0. [facility] can see you on Sunday for your second vaccination.

## 2021-06-13 ENCOUNTER — Ambulatory Visit (INDEPENDENT_AMBULATORY_CARE_PROVIDER_SITE_OTHER): Payer: BC Managed Care – PPO

## 2021-06-13 ENCOUNTER — Ambulatory Visit (INDEPENDENT_AMBULATORY_CARE_PROVIDER_SITE_OTHER): Payer: BC Managed Care – PPO | Admitting: Family Medicine

## 2021-06-13 VITALS — BP 116/70 | HR 71 | Temp 98.0°F | Ht 66.0 in | Wt 138.6 lb

## 2021-06-13 DIAGNOSIS — J189 Pneumonia, unspecified organism: Secondary | ICD-10-CM

## 2021-06-13 DIAGNOSIS — R0600 Dyspnea, unspecified: Secondary | ICD-10-CM | POA: Diagnosis not present

## 2021-06-13 LAB — POC INFLUENZA A&B (BINAX/QUICKVUE)
Influenza A, POC: NEGATIVE
Influenza B, POC: NEGATIVE

## 2021-06-13 MED ORDER — AZITHROMYCIN 250 MG PO TABS: ORAL_TABLET | ORAL | 0 refills | Status: AC

## 2021-06-13 NOTE — Progress Notes (Signed)
[facility] [address] [place] [address] Dept: [phone] Dept Fax: [phone]  Office Visit  Subjective:    Patient ID: Heidi Mccormick, female    DOB: 2001-03-02, 20 y.o..   MRN: [id number]  Chief Complaint  Patient presents with   Acute Visit    C/o having runny nose, chest congestion, persistent cough and chest tightness  x 1 week with SOB today.  Negative covid test today.  Marland Kitchen       History of Present Illness:  Patient is in today for evaluation of chest discomfort and dyspnea. Ms. Hor notes that she had developed body aches and sore throat a week ago. This was accompanied by a low-grade fever (100.0) and rhinorrhea. She had been feeling improvement over the week, but this morning, she woke up with chest tightness and pain with breathing in her sternal area. She has had some cough and some dyspnea with exertion. She ran a  home COVID test, which was negative.  Ms. Dwiggins was seen yesterday at the [facility] ED with a dog bite tot he face. She was prescribed Augmentin, but had not picked it up, as she was working today.  Past Medical History: Patient Active Problem List   Diagnosis Date Noted   Migraine 05/29/2021   No past surgical history on file.  Family History  Problem Relation Age of Onset   Miscarriages / Stillbirths Mother    Diabetes Father    Heart attack Father    Heart disease Father    High Cholesterol Father    High blood pressure Father    Asthma Brother    Diabetes Maternal Grandmother    Heart disease Maternal Grandmother    High Cholesterol Maternal Grandmother    High blood pressure Maternal Grandmother    Miscarriages / Stillbirths Maternal Grandmother    Breast cancer Maternal Grandmother    Cancer Maternal Grandfather    COPD Maternal Grandfather    High Cholesterol Maternal Grandfather    High blood pressure Maternal Grandfather    Kidney disease Maternal Grandfather     Early death Paternal Grandfather    Heart attack Paternal Grandfather    Heart disease Paternal Grandfather    Outpatient Medications Prior to Visit  Medication Sig Dispense Refill   Drospirenone (SLYND) 4 MG TABS Take 4 mg by mouth daily.     eletriptan (RELPAX) 40 MG tablet Take 1 tablet (40 mg total) by mouth as needed for migraine or headache. May repeat in 2 hours if headache persists or recurs. 10 tablet 0   ibuprofen (ADVIL) 800 MG tablet 1 tab po bid as needed for headache 30 tablet 1   propranolol ER (INDERAL LA) 80 MG 24 hr capsule Take 1 capsule (80 mg total) by mouth daily. 30 capsule 1   amoxicillin-clavulanate (AUGMENTIN) 875-125 MG tablet Take 1 tablet by mouth every 12 (twelve) hours. (Patient not taking: Reported on 06/13/2021) 14 tablet 0   LORazepam (ATIVAN) 0.5 MG tablet 1-2 tabs po bid prn severe anxiety (Patient not taking: Reported on 06/13/2021) 30 tablet 0   methylPREDNISolone (MEDROL DOSEPAK) 4 MG TBPK tablet Take as directed by packaging 1 each 0   No facility-administered medications prior to visit.   No Known Allergies    Objective:   Today's Vitals   06/13/21 1554  BP: 116/70  Pulse: 71  Temp: 98 F (36.7 C)  TempSrc: Temporal  Weight: 138 lb 9.6 oz (62.9 kg)  Height: 5'  6" (1.676 m)   Body mass index is 22.37 kg/m.   General: Well developed, well nourished. No acute distress. Lungs: Clear to auscultation bilaterally. No wheezing, rales or rhonchi. CV: Mild tachycardia, RRR without murmurs or rubs. Pulses 2+ bilaterally. Psych: Alert and oriented x3. Normal mood and affect.  Health Maintenance Due  Topic Date Due   HPV VACCINES (1 - 2-dose series) Never done   HIV Screening  Never done   Hepatitis C Screening  Never done   COVID-19 Vaccine (3 - Booster for Pfizer series) 01/23/2020     Imaging: Chest x-ray- Patch lower lobe infiltrates bilaterally with bronchial cuffing and increased lung markings.  Lab Results: POCT Influenza A& B:  Negative.  Assessment & Plan:   1. Community acquired bilateral lower lobe pneumonia We were unable to get an accurate O2 sat, as the pulse oximeter had trouble finding her pulse due to cool fingertips. It appears it may have been normal (98%) with a pulse of 103 when it was briefly tracking. The pulse rate was consistent with her heart rate by auscultation. Her chest x-ray appears consistent with an acute pneumonia. I advised her to take the Augmentin as prescribed. I will add a Z-pack to this. She should follow-up with her PCP in 2 weeks. I advised her to have a low threshold for ED visit for re-evaluation should her dyspnea worsen.  - DG Chest 2 View - POC Influenza A&B (Binax test) - Novel Coronavirus, NAA ([facility]) - azithromycin (ZITHROMAX) 250 MG tablet; Take 2 tablets on day 1, then 1 tablet daily on days 2 through 5  Dispense: 6 tablet; Refill: 0  Loyola Mast, MD

## 2021-06-13 NOTE — Patient Instructions (Signed)
Community-Acquired Pneumonia, Adult ?Pneumonia is a lung infection that causes inflammation and the buildup of mucus and fluids in the lungs. This may cause coughing and difficulty breathing. Community-acquired pneumonia is pneumonia that develops in people who are not, and have not recently been, in a hospital or other health care facility. ?Usually, pneumonia develops as a result of an illness that is caused by a virus, such as the common cold and the flu (influenza). It can also be caused by bacteria or fungi. While the common cold and influenza can pass from person to person (are contagious), pneumonia itself is not considered contagious. ?What are the causes? ?This condition may be caused by: ?Viruses. ?Bacteria. ?Fungi, such as molds or mushrooms. ?What increases the risk? ?The following factors may make you more likely to develop this condition: ?Having certain medical conditions, such as: ?A long-term (chronic) disease, which may include chronic obstructive pulmonary disease (COPD), asthma, heart failure, cystic fibrosis, diabetes, kidney disease, sickle cell disease, and human immunodeficiency virus (HIV). ?A condition that increases the risk of breathing in (aspirating) mucus and other fluids from your mouth and nose. ?A weakened body defense system (immune system). ?Having had your spleen removed (splenectomy). The spleen is the organ that helps fight germs and infections. ?Not cleaning your teeth and gums well (poor dental hygiene). ?Using tobacco products. ?Traveling to places where germs that cause pneumonia are present. ?Being near certain animals, or animal habitats, that have germs that cause pneumonia. ?Being older than 22 years of age. ?What are the signs or symptoms? ?Symptoms of this condition include: ?A dry cough or a wet (productive) cough. ?A fever. ?Sweating or chills. ?Chest pain, especially when breathing deeply or coughing. ?Fast breathing, difficulty breathing, or shortness of  breath. ?Tiredness (fatigue). ?Muscle aches. ?How is this diagnosed? ?This condition may be diagnosed based on your medical history or a physical exam. You may also have tests, including: ?Chest X-rays. ?Tests of the level of oxygen and other gases in your blood. ?Tests of: ?Your blood. ?Mucus from your lungs (sputum). ?Fluid around your lungs (pleural fluid). ?Your urine. ?If your pneumonia is severe, other tests may be done to learn more about the cause. ?How is this treated? ?Treatment for this condition depends on many factors, such as the cause of your pneumonia, your medicines, and other medical conditions that you have. ?For most adults, pneumonia may be treated at home. In some cases, treatment must happen in a hospital and may include: ?Medicines that are given by mouth (orally) or through an IV, including: ?Antibiotic medicines, if bacteria caused the pneumonia. ?Medicines that kill viruses (antiviral medicines), if a virus caused the pneumonia. ?Oxygen therapy. ?Severe pneumonia, although rare, may require the following treatments: ?Mechanical ventilation.This procedure uses a machine to help you breathe if you cannot breathe well on your own or maintain a safe level of blood oxygen. ?Thoracentesis. This procedure removes any buildup of pleural fluid to help with breathing. ?Follow these instructions at home: ?Medicines ?Take over-the-counter and prescription medicines only as told by your health care provider. ?Take cough medicine only if you have trouble sleeping. Cough medicine can prevent your body from removing mucus from your lungs. ?If you were prescribed an antibiotic medicine, take it as told by your health care provider. Do not stop taking the antibiotic even if you start to feel better. ?Lifestyle ?  ?Do not drink alcohol. ?Do not use any products that contain nicotine or tobacco, such as cigarettes, e-cigarettes, and chewing tobacco. If you   need help quitting, ask your health care  provider. ?Eat a healthy diet. This includes plenty of vegetables, fruits, whole grains, low-fat dairy products, and lean protein. ?General instructions ?Rest a lot and get at least 8 hours of sleep each night. ?Sleep in a partly upright position at night. Place a few pillows under your head or sleep in a reclining chair. ?Return to your normal activities as told by your health care provider. Ask your health care provider what activities are safe for you. ?Drink enough fluid to keep your urine pale yellow. This helps to thin the mucus in your lungs. ?If your throat is sore, gargle with a salt-water mixture 3-4 times a day or as needed. To make a salt-water mixture, completely dissolve ?-1 tsp (3-6 g) of salt in 1 cup (237 mL) of warm water. ?Keep all follow-up visits as told by your health care provider. This is important. ?How is this prevented? ?You can lower your risk of developing community-acquired pneumonia by: ?Getting the pneumonia vaccine. There are different types and schedules of pneumonia vaccines. Ask your health care provider which option is best for you. Consider getting the pneumonia vaccine if: ?You are older than 22 years of age. ?You are 19-65 years of age and are receiving cancer treatment, have chronic lung disease, or have other medical conditions that affect your immune system. Ask your health care provider if this applies to you. ?Getting your influenza vaccine every year. Ask your health care provider which type of vaccine is best for you. ?Getting regular dental checkups. ?Washing your hands often with soap and water for at least 20 seconds. If soap and water are not available, use hand sanitizer. ?Contact a health care provider if you have: ?A fever. ?Trouble sleeping because you cannot control your cough with cough medicine. ?Get help right away if: ?Your shortness of breath becomes worse. ?Your chest pain increases. ?Your sickness becomes worse, especially if you are an older adult or  have a weak immune system. ?You cough up blood. ?These symptoms may represent a serious problem that is an emergency. Do not wait to see if the symptoms will go away. Get medical help right away. Call your local emergency services (911 in the [place] Do not drive yourself to the hospital. ?Summary ?Pneumonia is an infection of the lungs. ?Community-acquired pneumonia develops in people who have not been in the hospital. It can be caused by bacteria, viruses, or fungi. ?This condition may be treated with antibiotics or antiviral medicines. ?Severe pneumonia may require a hospital stay and treatment to help with breathing. ?This information is not intended to replace advice given to you by your health care provider. Make sure you discuss any questions you have with your health care provider. ?Document Revised: 04/25/2019 Document Reviewed: 04/25/2019 ?Elsevier Patient Education ? 2022 Elsevier Inc. ? ?

## 2021-06-14 LAB — SARS-COV-2, NAA 2 DAY TAT

## 2021-06-14 LAB — NOVEL CORONAVIRUS, NAA: SARS-CoV-2, NAA: NOT DETECTED

## 2021-07-09 ENCOUNTER — Other Ambulatory Visit: Payer: Self-pay | Admitting: Family Medicine

## 2021-07-11 ENCOUNTER — Ambulatory Visit: Payer: BC Managed Care – PPO | Admitting: Family Medicine

## 2021-08-01 ENCOUNTER — Encounter: Payer: Self-pay | Admitting: Family Medicine

## 2021-08-01 ENCOUNTER — Ambulatory Visit (INDEPENDENT_AMBULATORY_CARE_PROVIDER_SITE_OTHER): Payer: No Typology Code available for payment source | Admitting: Family Medicine

## 2021-08-01 ENCOUNTER — Other Ambulatory Visit: Payer: Self-pay

## 2021-08-01 VITALS — BP 113/76 | HR 97 | Temp 98.3°F | Ht 66.0 in | Wt 145.6 lb

## 2021-08-01 DIAGNOSIS — G4489 Other headache syndrome: Secondary | ICD-10-CM

## 2021-08-01 DIAGNOSIS — Z8701 Personal history of pneumonia (recurrent): Secondary | ICD-10-CM

## 2021-08-01 MED ORDER — PROPRANOLOL HCL ER 80 MG PO CP24: 80.0000 mg | ORAL_CAPSULE | Freq: Every day | ORAL | 3 refills | Status: DC

## 2021-08-01 NOTE — Progress Notes (Signed)
OFFICE VISIT  08/01/2021  CC:  Chief Complaint  Patient presents with   Follow-up    headaches   HPI:    Patient is a 21 y.o. female who presents for 2 mo f/u HA syndrome.  INTERIM HX: When I last saw her she was improving.  She was to continue propranolol prophylaxis as well as ibuprofen or eletriptan as abortive. She had just recently seen the neurologist on 05/29/2021 and plan was to get MRI brain.  It does not appear this has been done yet.  Update: Patient's headaches are much better.  She takes Inderal LA 80 mg every night and has no side effects. Has had only 2 migraines in the last 4 to 6 weeks, did not respond to [facility] triptan.  Ibuprofen has worked better as abortive medicine in the past.  She describes her typical migraine as unilateral throbbing-right side, photo and phonophobia, mild nausea.  No aura. Has had only occasional mild tension headache other than those 2 migraines. 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  On 06/13/2021 she had a respiratory illness, saw a provider at [facility], was diagnosed with pneumonia--Augmentin and Zithromax prescribed. She is completely back to feeling well now.  PMP AWARE reviewed today: most recent rx for lorazepam was filled 04/04/20, # 30, rx by me. No red flags.   Past Medical History:  Diagnosis Date   Abnormal vaginal bleeding    Dr. Aldona Bar   Acne vulgaris    Headache syndrome    summer 2022   Menometrorrhagia    Started Yaz 01/30/19   Vasovagal syncope    summer 2022    History reviewed. No pertinent surgical history.  Outpatient Medications Prior to Visit  Medication Sig Dispense Refill   Drospirenone (SLYND) 4 MG TABS Take 4 mg by mouth daily.     ibuprofen (ADVIL) 800 MG tablet 1 tab po bid as needed for headache 30 tablet 1   eletriptan (RELPAX) 40 MG tablet Take 1 tablet (40 mg total) by mouth as needed for migraine or headache. May repeat  in 2 hours if headache persists or recurs. 10 tablet 0   LORazepam (ATIVAN) 0.5 MG tablet 1-2 tabs po bid prn severe anxiety 30 tablet 0   propranolol ER (INDERAL LA) 80 MG 24 hr capsule Take 1 capsule (80 mg total) by mouth daily. 30 capsule 1   amoxicillin-clavulanate (AUGMENTIN) 875-125 MG tablet Take 1 tablet by mouth every 12 (twelve) hours. (Patient not taking: Reported on 06/13/2021) 14 tablet 0   No facility-administered medications prior to visit.    No Known Allergies  ROS As per HPI  PE: Vitals with BMI 08/01/2021 06/13/2021 06/12/2021  Height 5\' 6"  5\' 6"  -  Weight 145 lbs 10 oz 138 lbs 10 oz 136 lbs  BMI 23.51 22.38 21.97  Systolic 113 116 -  Diastolic 76 70 -  Pulse 97 71 -    Physical Exam  Wt Readings from Last 2 Encounters:  08/01/21 145 lb 9.6 oz (66 kg)  06/13/21 138 lb 9.6 oz (62.9 kg)    Gen: alert, oriented x 4, affect pleasant.  Lucid thinking and conversation noted. HEENT: PERRLA, EOMI.   Neck: no LAD, mass, or thyromegaly. CV: RRR, no m/r/g LUNGS: CTA bilat, nonlabored. NEURO: no tremor or tics noted on observation.  Coordination intact. CN 2-12 grossly intact bilaterally, strength 5/5 in all extremeties.  No ataxia.  LABS:  Lab Results  Component Value Date   TSH 2.51 05/05/2021   Lab Results  Component Value Date   WBC 8.8 05/05/2021   HGB 14.6 05/05/2021   HCT 44.3 05/05/2021   MCV 91.9 05/05/2021   PLT 266.0 05/05/2021   Lab Results  Component Value Date   CREATININE 0.80 05/05/2021   BUN 9 05/05/2021   NA 137 05/05/2021   K 3.5 05/05/2021   CL 102 05/05/2021   CO2 24 05/05/2021   Lab Results  Component Value Date   ALT 13 05/05/2021   AST 18 05/05/2021   ALKPHOS 64 05/05/2021   BILITOT 0.5 05/05/2021   IMPRESSION AND PLAN:  1) Headache syndrome--much improved on Inderal LA 80 mg a day. She will resume use of ibuprofen 800 mg as her abortive med.  Ella triptan ineffective. Stress level is better and she thinks this was  a big trigger for her headaches. At this point she has decided to hold off on getting a brain MRI that neurology ordered.  2) community-acquired pneumonia November 2022.  Completely resolved status post antibiotic treatment.  An After Visit Summary was printed and given to the patient.  FOLLOW UP: Return in about 6 months (around 01/29/2022) for f/u HA syndrome.  Signed:  Santiago Bumpers, MD           08/01/2021

## 2021-08-08 ENCOUNTER — Other Ambulatory Visit (HOSPITAL_COMMUNITY): Payer: Self-pay

## 2021-08-08 MED ORDER — PROPRANOLOL HCL ER 80 MG PO CP24
80.0000 mg | ORAL_CAPSULE | Freq: Every day | ORAL | 3 refills | Status: DC
Start: 2021-08-01 — End: 2021-08-13
  Filled 2021-08-08: qty 90, 90d supply, fill #0

## 2021-08-11 ENCOUNTER — Other Ambulatory Visit (HOSPITAL_COMMUNITY): Payer: Self-pay

## 2021-08-12 ENCOUNTER — Other Ambulatory Visit (HOSPITAL_COMMUNITY): Payer: Self-pay

## 2021-08-13 ENCOUNTER — Other Ambulatory Visit (HOSPITAL_COMMUNITY): Payer: Self-pay

## 2021-08-13 ENCOUNTER — Other Ambulatory Visit: Payer: Self-pay | Admitting: Family Medicine

## 2021-08-13 MED ORDER — PROPRANOLOL HCL 40 MG PO TABS: ORAL_TABLET | ORAL | 3 refills | Status: DC

## 2021-08-13 NOTE — Telephone Encounter (Signed)
Pharmacy comment: ER capsules are $45/90d.  IR tabs (BID) are $0.  Pt would like IR tablets for cost savings.  ecm 08/13/21   Please review and advise

## 2021-08-13 NOTE — Telephone Encounter (Signed)
OK, 40mg  propranolol IR tabs eRx'd

## 2021-08-14 ENCOUNTER — Telehealth (INDEPENDENT_AMBULATORY_CARE_PROVIDER_SITE_OTHER): Payer: No Typology Code available for payment source | Admitting: Nurse Practitioner

## 2021-08-14 ENCOUNTER — Encounter: Payer: Self-pay | Admitting: Nurse Practitioner

## 2021-08-14 ENCOUNTER — Other Ambulatory Visit (HOSPITAL_COMMUNITY): Payer: Self-pay

## 2021-08-14 VITALS — Temp 97.5°F

## 2021-08-14 DIAGNOSIS — J069 Acute upper respiratory infection, unspecified: Secondary | ICD-10-CM | POA: Diagnosis not present

## 2021-08-14 LAB — POCT INFLUENZA A/B
Influenza A, POC: NEGATIVE
Influenza B, POC: NEGATIVE

## 2021-08-14 LAB — POCT RAPID STREP A (OFFICE): Rapid Strep A Screen: NEGATIVE

## 2021-08-14 MED ORDER — PREDNISONE 10 MG (21) PO TBPK
ORAL_TABLET | ORAL | 0 refills | Status: DC
  Filled 2021-08-14: qty 21, 6d supply, fill #0

## 2021-08-14 NOTE — Telephone Encounter (Signed)
Pt advised refill sent. °

## 2021-08-14 NOTE — Progress Notes (Signed)
[facility] [address] [place] [address] Dept: [phone] Dept Fax: [phone]  Virtual Video Visit  I connected with Heidi Mccormick on 08/14/21 at 10:40 AM EST by a video enabled telemedicine application and verified that I am speaking with the correct person using two identifiers.  Location patient: Home Location provider: Clinic Persons participating in the virtual visit: Patient, Provider, CMA  I discussed the limitations of evaluation and management by telemedicine and the availability of in person appointments. The patient expressed understanding and agreed to proceed.  Chief Complaint  Patient presents with   Sore Throat    Pt c/o sore throat, cough, headache, w/ pain in ears when coughing At-home covid test neg.     SUBJECTIVE:  HPI: Heidi Mccormick is a 21 y.o. female who presents with a sore throat, cough, and headache for 2 days.  UPPER RESPIRATORY TRACT INFECTION  Fever: no Cough: yes Shortness of breath: no Wheezing: no Chest pain: no Chest tightness: no Chest congestion: yes Nasal congestion: yes Runny nose: yes Post nasal drip: yes Sneezing: yes Sore throat: yes Swollen glands: yes Sinus pressure: no Headache: yes Face pain: no Toothache: no Ear pain: yes - bilateral  Ear pressure: yes bilateral Eyes red/itching:no Eye drainage/crusting: no  Vomiting: no Rash: no Fatigue: yes Sick contacts: no Strep contacts: no  Context: stable Recurrent sinusitis: no Relief with OTC cold/cough medications: yes  Treatments attempted: tylenol, dayquil    Patient Active Problem List   Diagnosis Date Noted   Migraine 05/29/2021    History reviewed. No pertinent surgical history.  Family History  Problem Relation Age of Onset   Miscarriages / Stillbirths Mother    Diabetes Father    Heart attack Father    Heart disease Father    High Cholesterol Father    High blood pressure Father     Asthma Brother    Diabetes Maternal Grandmother    Heart disease Maternal Grandmother    High Cholesterol Maternal Grandmother    High blood pressure Maternal Grandmother    Miscarriages / Stillbirths Maternal Grandmother    Breast cancer Maternal Grandmother    Cancer Maternal Grandfather    COPD Maternal Grandfather    High Cholesterol Maternal Grandfather    High blood pressure Maternal Grandfather    Kidney disease Maternal Grandfather    Early death Paternal Grandfather    Heart attack Paternal Grandfather    Heart disease Paternal Grandfather     Social History   Tobacco Use   Smoking status: Never   Smokeless tobacco: Never  Substance Use Topics   Alcohol use: Never   Drug use: Never     Current Outpatient Medications:    predniSONE (STERAPRED UNI-PAK 21 TAB) 10 MG (21) TBPK tablet, Take as directed, Disp: 21 tablet, Rfl: 0   Drospirenone (SLYND) 4 MG TABS, Take 4 mg by mouth daily., Disp: , Rfl:    ibuprofen (ADVIL) 800 MG tablet, 1 tab po bid as needed for headache, Disp: 30 tablet, Rfl: 1   propranolol (INDERAL) 40 MG tablet, 1 tab po bid, Disp: 180 tablet, Rfl: 3  No Known Allergies  ROS: See pertinent positives and negatives per HPI.  OBSERVATIONS/OBJECTIVE:  VITALS per patient if applicable: Today's Vitals   08/14/21 1047  Temp: (!) 97.5 F (36.4 C)   There is no height or weight on file to calculate BMI.    GENERAL: Alert and oriented. Appears well and in no  acute distress.  HEENT: Atraumatic. Conjunctiva clear. No obvious abnormalities on inspection of external nose and ears.  NECK: Normal movements of the head and neck.  LUNGS: On inspection, no signs of respiratory distress. Breathing rate appears normal. No obvious gross SOB, gasping or wheezing, and no conversational dyspnea.  CV: No obvious cyanosis.  MS: Moves all visible extremities without noticeable abnormality.  PSYCH/NEURO: Pleasant and cooperative. No obvious depression or  anxiety. Speech and thought processing grossly intact.  ASSESSMENT AND PLAN:  Upper Respiratory Infection  Strep, and flu negative in office. Covid-19 inconclusive. Will treat with prednisone taper. Encourage rest, fluids. Can continue tylenol/ibuprofen and dayquil, being mindful not to go over 3,000mg  tylenol in a day. Follow up if symptoms worsen or don't improve.    I discussed the assessment and treatment plan with the patient. The patient was provided an opportunity to ask questions and all were answered. The patient agreed with the plan and demonstrated an understanding of the instructions.   The patient was advised to call back or seek an in-person evaluation if the symptoms worsen or if the condition fails to improve as anticipated.  Time spent on call:  10 minutes with patient face to face via video conference. More than 50% of this time was spent in counseling and coordination of care. 5 minutes total spent in review of patient's record and preparation of their chart.   Gerre Scull, NP

## 2021-08-14 NOTE — Patient Instructions (Addendum)
It was great to see you!  I have sent a prednisone taper to your pharmacy. Start this today, take with food and in the mornings.   Your symptoms and exam findings are most consistent with a viral upper respiratory infection. These usually run their course in 5-7 days. Unfortunately, antibiotics don't work against viruses and just increase your risk of other issues such as diarrhea, yeast infections, and resistant infections.  If you start feeling worse with facial pain, high fever, cough, shortness of breath or start feeling significantly worse, please call us right away to be further evaluated.  Some things that can make you feel better are: - Increased rest - Increasing Fluids - Acetaminophen / ibuprofen as needed for fever/pain.  - Salt water gargling, chloraseptic spray and throat lozenges - OTC pseudoephedrine or coricidin if you have a history of high blood pressure or take blood pressure medications - Mucinex.  - Saline sinus flushes or a neti pot.  - Humidifying the air.  Follow up if symptoms are not improving.   Take care,  Rodman Pickle, NP

## 2021-08-22 ENCOUNTER — Other Ambulatory Visit (HOSPITAL_COMMUNITY): Payer: Self-pay

## 2021-09-23 ENCOUNTER — Other Ambulatory Visit (HOSPITAL_COMMUNITY): Payer: Self-pay

## 2021-09-26 ENCOUNTER — Other Ambulatory Visit (HOSPITAL_COMMUNITY): Payer: Self-pay

## 2021-10-01 ENCOUNTER — Other Ambulatory Visit (HOSPITAL_COMMUNITY): Payer: Self-pay

## 2021-10-06 ENCOUNTER — Encounter: Payer: Self-pay | Admitting: Family Medicine

## 2021-10-06 ENCOUNTER — Ambulatory Visit (INDEPENDENT_AMBULATORY_CARE_PROVIDER_SITE_OTHER): Payer: No Typology Code available for payment source | Admitting: Family Medicine

## 2021-10-06 ENCOUNTER — Other Ambulatory Visit: Payer: Self-pay

## 2021-10-06 VITALS — BP 128/83 | HR 101 | Temp 98.1°F | Ht 66.0 in | Wt 145.6 lb

## 2021-10-06 DIAGNOSIS — L505 Cholinergic urticaria: Secondary | ICD-10-CM

## 2021-10-06 DIAGNOSIS — M25562 Pain in left knee: Secondary | ICD-10-CM

## 2021-10-06 DIAGNOSIS — I7300 Raynaud's syndrome without gangrene: Secondary | ICD-10-CM | POA: Diagnosis not present

## 2021-10-06 DIAGNOSIS — G8929 Other chronic pain: Secondary | ICD-10-CM | POA: Diagnosis not present

## 2021-10-06 NOTE — Progress Notes (Signed)
OFFICE VISIT ? ?10/06/2021 ? ?CC:  ?Chief Complaint  ?Patient presents with  ? Leg Swelling  ?  Bilateral, occurring from knee down for 2-3 years. Toes turn white on both feet and numbness occurs.   ? ?Patient is a 21 y.o. female who presents for multiple concerns: ? ? ?HPI: ?#1: Chronic, recurrent cold sensation with pallor color changes in toes.  Somewhat random but she admits it is more often and more severe when in the cold.  Usually last around 10 minutes.  No pain.  No symptoms in the hands. ? ?#2:  Chronic, recurrent reddish/hive-like tingly rash on legs, hands, trunk, arms--- consistently induced by hot shower.  Again, resolved after about 10 minutes after the shower. ? ?#3 some swelling in the ankles for a few years, seems to wax and wane.  Worse when on her feet all day or when sitting for prolonged period. ?No association with starting her risperidone in the past. ? ?ROS as above, plus--> no weight loss, no fevers, no CP, no SOB, no wheezing, no cough, no dizziness, no HAs,  no melena/hematochezia.  No polyuria or polydipsia.  No myalgias or arthralgias other than left knee pain.  No focal weakness, paresthesias, or tremors.  No acute vision or hearing abnormalities.  No dysuria or unusual/new urinary urgency or frequency.   ?No n/v/d or abd pain.  No palpitations.   ? ?Past Medical History:  ?Diagnosis Date  ? Abnormal vaginal bleeding   ? Dr. Stann Mainland  ? Acne vulgaris   ? Headache syndrome   ? summer 2022  ? History of community acquired pneumonia 05/2021  ? Menometrorrhagia   ? Started Yaz 01/30/19  ? Vasovagal syncope   ? summer 2022  ? ? ?History reviewed. No pertinent surgical history. ? ?Outpatient Medications Prior to Visit  ?Medication Sig Dispense Refill  ? Drospirenone (SLYND) 4 MG TABS Take 4 mg by mouth daily.    ? ibuprofen (ADVIL) 800 MG tablet Take 1 tablet (800 mg total) by mouth 2 (two) times daily as needed for headache 30 tablet 0  ? propranolol (INDERAL) 40 MG tablet 1 tab po bid 180 tablet  3  ? predniSONE (STERAPRED UNI-PAK 21 TAB) 10 MG (21) TBPK tablet Take as directed 21 tablet 0  ? ?No facility-administered medications prior to visit.  ? ? ?No Known Allergies ? ?ROS ?As per HPI ? ?PE: ?Vitals with BMI 10/06/2021 08/01/2021 06/13/2021  ?Height 5\' 6"  5\' 6"  5\' 6"   ?Weight 145 lbs 10 oz 145 lbs 10 oz 138 lbs 10 oz  ?BMI 23.51 23.51 22.38  ?Systolic 0000000 [id number]  ?Diastolic 83 76 70  ?Pulse 101 97 71  ? ?Physical Exam ? ?General: Alert and well-appearing. ?Extremities: Hands without rash, swelling, or color changes. ?Legs without rash, swelling, or color changes. ?Dorsalis pedis and posterior tibial pulses 2+ bilaterally. ?Feet and toes feel warm.  Sensation intact.  Strength intact. ? ?LABS:  ?Last CBC ?Lab Results  ?Component Value Date  ? WBC 8.8 05/05/2021  ? HGB 14.6 05/05/2021  ? HCT 44.3 05/05/2021  ? MCV 91.9 05/05/2021  ? RDW 13.1 05/05/2021  ? PLT 266.0 05/05/2021  ? ?Last metabolic panel ?Lab Results  ?Component Value Date  ? GLUCOSE 93 05/05/2021  ? NA 137 05/05/2021  ? K 3.5 05/05/2021  ? CL 102 05/05/2021  ? CO2 24 05/05/2021  ? BUN 9 05/05/2021  ? CREATININE 0.80 05/05/2021  ? CALCIUM 9.8 05/05/2021  ? PROT 7.8 05/05/2021  ?  ALBUMIN 4.7 05/05/2021  ? BILITOT 0.5 05/05/2021  ? ALKPHOS 64 05/05/2021  ? AST 18 05/05/2021  ? ALT 13 05/05/2021  ? ? ?IMPRESSION AND PLAN: ? ?#1 Raynaud's phenomenon. ?Discussed the diagnosis with the patient, reassured. ?No treatment recommended at this time.  Avoidance of prolonged cold temperatures recommended. ?Signs/symptoms to call or return for were reviewed and pt expressed understanding. ? ?#2 cholinergic urticaria. ?Discussed diagnosis.  Reassured patient. ?This does not cause excessive itching, resolves pretty quickly. ?Therefore I think any preventative treatment with antihistamine is not recommended at this time. ? ?#3 ankles swelling.  Reassured.   ?No sign of venous insufficiency or volume overload. ?Minimize sodium intake, avoid prolonged standing.   Elevate as needed. ?Encouraged exercise. ? ?#4 chronic left knee pain. ?She brought this up at the end of today's visit as we did not get to discuss it in any detail. ?We discussed possible referral to specialist for further evaluation and she was agreeable. ?Ordered referral to sports medicine today. ? ?An After Visit Summary was printed and given to the patient. ? ?FOLLOW UP: No follow-ups on file. ? ?Signed:  Crissie Sickles, MD           10/06/2021 ? ?

## 2021-11-03 ENCOUNTER — Other Ambulatory Visit (HOSPITAL_COMMUNITY): Payer: Self-pay

## 2021-11-03 MED ORDER — SLYND 4 MG PO TABS
1.0000 | ORAL_TABLET | Freq: Every day | ORAL | 3 refills | Status: DC
  Filled 2021-11-03: qty 84, 84d supply, fill #0
  Filled 2022-01-14: qty 84, 84d supply, fill #1
  Filled 2022-03-25: qty 84, 84d supply, fill #2
  Filled 2022-06-02 – 2022-06-09 (×2): qty 84, 84d supply, fill #3

## 2021-11-10 ENCOUNTER — Ambulatory Visit (INDEPENDENT_AMBULATORY_CARE_PROVIDER_SITE_OTHER): Payer: No Typology Code available for payment source

## 2021-11-10 ENCOUNTER — Other Ambulatory Visit (HOSPITAL_COMMUNITY): Payer: Self-pay

## 2021-11-10 ENCOUNTER — Ambulatory Visit (INDEPENDENT_AMBULATORY_CARE_PROVIDER_SITE_OTHER): Payer: No Typology Code available for payment source | Admitting: Sports Medicine

## 2021-11-10 DIAGNOSIS — Z09 Encounter for follow-up examination after completed treatment for conditions other than malignant neoplasm: Secondary | ICD-10-CM

## 2021-11-10 DIAGNOSIS — M2240 Chondromalacia patellae, unspecified knee: Secondary | ICD-10-CM | POA: Insufficient documentation

## 2021-11-10 DIAGNOSIS — M1710 Unilateral primary osteoarthritis, unspecified knee: Secondary | ICD-10-CM | POA: Insufficient documentation

## 2021-11-10 DIAGNOSIS — M2242 Chondromalacia patellae, left knee: Secondary | ICD-10-CM

## 2021-11-10 MED ORDER — MELOXICAM 15 MG PO TABS
ORAL_TABLET | ORAL | 3 refills | Status: AC
  Filled 2021-11-10: qty 30, 30d supply, fill #0

## 2021-11-10 NOTE — Progress Notes (Signed)
? ? ?  Procedures performed today:   ? ?None. ? ?Independent interpretation of notes and tests performed by another provider:  ? ?None. ? ?Brief History, Exam, Impression, and Recommendations:   ? 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 ?On exam she has patellar crepitus, pain at the patellar facets, weak hip abductors and poor quadriceps definition. ?We discussed the anatomy and pathophysiology, she will do meloxicam, x-rays, formal physical therapy focusing on hip abductor's and vastus medialis, return to see me in 6 weeks, injection +/- MRI if not better. ?Of note she did try convertible workstation that worsened her pain. ? ?Chronic process with exacerbation and pharmacologic intervention ? ?___________________________________________ ?Ihor Austin. Benjamin Stain, M.D., ABFM., CAQSM. ?Primary Care and Sports Medicine ?[facility] ? ?Adjunct Instructor of Family Medicine  ?[employer] ?

## 2021-11-10 NOTE — Assessment & Plan Note (Signed)
Pleasant 21 year old female, works with Dr. Estanislado Pandy with rheumatology, over a year of pain left knee, retropatellar, worse with kneeling, going up and down stairs. ?On exam she has patellar crepitus, pain at the patellar facets, weak hip abductors and poor quadriceps definition. ?We discussed the anatomy and pathophysiology, she will do meloxicam, x-rays, formal physical therapy focusing on hip abductor's and vastus medialis, return to see me in 6 weeks, injection +/- MRI if not better. ?Of note she did try convertible workstation that worsened her pain. ?

## 2021-11-25 ENCOUNTER — Ambulatory Visit: Payer: No Typology Code available for payment source | Attending: Sports Medicine

## 2021-11-25 DIAGNOSIS — M25562 Pain in left knee: Secondary | ICD-10-CM | POA: Insufficient documentation

## 2021-11-25 DIAGNOSIS — M2241 Chondromalacia patellae, right knee: Secondary | ICD-10-CM | POA: Diagnosis present

## 2021-11-25 DIAGNOSIS — G8929 Other chronic pain: Secondary | ICD-10-CM | POA: Insufficient documentation

## 2021-11-25 DIAGNOSIS — M6281 Muscle weakness (generalized): Secondary | ICD-10-CM | POA: Insufficient documentation

## 2021-11-25 DIAGNOSIS — M2242 Chondromalacia patellae, left knee: Secondary | ICD-10-CM | POA: Diagnosis not present

## 2021-11-25 NOTE — Therapy (Signed)
?OUTPATIENT PHYSICAL THERAPY LOWER EXTREMITY EVALUATION ? ? ?Patient Name: Heidi Mccormick ?MRN: [id number] ?DOB:2001/06/15, 21 y.o., female ?Today's Date: 11/25/2021 ? ? PT End of Session - 11/25/21 1842   ? ? Visit Number 1   ? Number of Visits 6   ? Date for PT Re-Evaluation 01/06/22   ? Authorization Type [place]   ? PT Start Time 1745   ? PT Stop Time 1830   ? PT Time Calculation (min) 45 min   ? Activity Tolerance Patient tolerated treatment well   ? Behavior During Therapy [facility] for tasks assessed/performed   ? ?  ?  ? ?  ? ? ?Past Medical History:  ?Diagnosis Date  ? Abnormal vaginal bleeding   ? Dr. Aldona Bar  ? Acne vulgaris   ? Headache syndrome   ? summer 2022  ? History of community acquired pneumonia 05/2021  ? Menometrorrhagia   ? Started [facility] 01/30/19  ? Vasovagal syncope   ? summer 2022  ? ?History reviewed. No pertinent surgical history. ?Patient Active Problem List  ? Diagnosis Date Noted  ? Chondromalacia of patellofemoral joint, left 11/10/2021  ? Migraine 05/29/2021  ? ? ?PCP: Jeoffrey Massed, MD ? ?REFERRING PROVIDER: Monica Becton, MD ? ?REFERRING DIAG: Muscle weakness (generalized) ? ?Chronic pain of left knee ? ?Chondromalacia of right patellofemoral joint ? ?THERAPY DIAG:  ?Muscle weakness (generalized) - Plan: PT plan of care cert/re-cert ? ?Chronic pain of left knee - Plan: PT plan of care cert/re-cert ? ?Chondromalacia of right patellofemoral joint - Plan: PT plan of care cert/re-cert ? ?ONSET DATE: chronic,  ? ?SUBJECTIVE:  ? ?SUBJECTIVE STATEMENT: ?L knee pain ongoing since age 67, has delayed treatment to date, managed with OTC meds, pain with stairs and getting down to floor level, history of patella alta ? ?PERTINENT HISTORY: 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 ?On exam she has patellar crepitus, pain at the  patellar facets, weak hip abductors and poor quadriceps definition. ?We discussed the anatomy and pathophysiology, she will do meloxicam, x-rays, formal physical therapy focusing on hip abductor's and vastus medialis, return to see me in 6 weeks, injection +/- MRI if not better. ?Of note she did try convertible workstation that worsened her pain. ? ?PAIN:  ?Are you having pain? Yes: NPRS scale: 5/10 ?Pain location: L knee ?Pain description: ache ?Aggravating factors: stairs, squatting ?Relieving factors: rest ? ?PRECAUTIONS: None ? ?WEIGHT BEARING RESTRICTIONS No ? ?FALLS:  ?Has patient fallen in last 6 months? No ? ?LIVING ENVIRONMENT: ?Lives with: alone ?Lives in: House/apartment ?Stairs: 16 ? ?OCCUPATION: office worker ? ?PLOF: Independent ? ?PATIENT GOALS to reduce and manage my pain ? ? ?OBJECTIVE:  ? ?DIAGNOSTIC FINDINGS: none available ? ?PATIENT SURVEYS:  ?FOTO 54(72 predicted) ? ?COGNITION: ? Overall cognitive status: Within functional limits for tasks assessed   ?  ?SENSATION: ?WFL ? ?MUSCLE LENGTH: ?Hamstrings: Right 80 deg; Left 80 deg ? ? ?POSTURE:  ?Mild rounded shoulders ? ?PALPATION: ?Retropatellar crepitus ? ?LE ROM: WNL throughout ? ?AROM Right ?11/25/2021 Left ?11/25/2021  ?Hip flexion    ?Hip extension    ?Hip abduction    ?Hip adduction    ?Hip internal rotation    ?Hip external rotation    ?Knee flexion    ?Knee extension    ?Ankle dorsiflexion    ?Ankle plantarflexion    ?Ankle  inversion    ?Ankle eversion    ? (Blank rows = not tested) ? ?LE MMT: ? ?MMT Right ?11/25/2021 Left ?11/25/2021  ?Hip flexion 5 4  ?Hip extension 5 4  ?Hip abduction 5 4  ?Hip adduction    ?Hip internal rotation    ?Hip external rotation    ?Knee flexion 5 4  ?Knee extension 5 4  ?Ankle dorsiflexion    ?Ankle plantarflexion    ?Ankle inversion    ?Ankle eversion    ? (Blank rows = not tested) ? ?LOWER EXTREMITY SPECIAL TESTS:  ?Knee special tests: Patellafemoral apprehension test: negative, Patellafemoral grind test: positive  , and Patella tap test (ballotable patella): positive  ? ?FUNCTIONAL TESTS:  ?5 times sit to stand: 8s arms crossed ?L VMO latency detected compared to VL ? ?GAIT: ?Distance walked: 69ft x2 ?Assistive device utilized: None ?Level of assistance: Complete Independence ? ? ? ?TODAY'S TREATMENT: ?Eval and HEP ? ? ?PATIENT EDUCATION:  ?Education details: Discussed eval findings, rehab rationale and POC and patient is in agreement  ?Person educated: Patient ?Education method: Explanation ?Education comprehension: verbalized understanding, returned demonstration, and needs further education ? ? ?HOME EXERCISE PROGRAM: ?Access Code: [email or web address] ?URL: [email or web address] ?Date: 11/25/2021 ?Prepared by: Gustavus Bryant ? ?Exercises ?- Supine Quad Set  - 2 x daily - 7 x weekly - 2 sets - 15 reps - 3s hold ?- Supine Quad Set  - 2 x daily - 7 x weekly - 2 sets - 15 reps - 3s hold ?- Bilateral Long Arc Quad  - 2 x daily - 7 x weekly - 2 sets - 15 reps - 3s hold ?- Sidelying Hip Abduction  - 2 x daily - 7 x weekly - 2 sets - 15 reps - 3s hold ? ?ASSESSMENT: ? ?CLINICAL IMPRESSION: ?Patient is a 21 y.o. female who was seen today for physical therapy evaluation and treatment for L knee pain due to underlying chondromalacia. AROM WNL, no laxity present, mild/trace joint effusion, positive grind test, L VMO delayed activation compared to VL resulting in lateral patellar displacement. ? ? ?OBJECTIVE IMPAIRMENTS decreased activity tolerance, decreased knowledge of condition, decreased strength, pain, and patella alta B .  ? ?ACTIVITY LIMITATIONS community activity, occupation, and yard work.  ? ?PERSONAL FACTORS Time since onset of injury/illness/exacerbation are also affecting patient's functional outcome.  ? ? ?REHAB POTENTIAL: Good ? ?CLINICAL DECISION MAKING: Stable/uncomplicated ? ?EVALUATION COMPLEXITY: Low ? ? ?GOALS: ?Goals reviewed with patient? Yes ? ?SHORT TERM GOALS: Target date: 12/09/2021 ? ?Patient to  demonstrate independence in HEP  ?Baseline:Access Code: [email or web address] ?Goal status: INITIAL ? ?2.  Consider obtaining patellar tracking brace ?Baseline: TBD ?Goal status: INITIAL ? ? ? ?LONG TERM GOALS: Target date: 01/06/2022 ? ?Decrease pain to 2/10 at worst ?Baseline: 5/10 at worst ?Goal status: INITIAL ? ?2.  Increase L knee/hip strength to 4+/5 ?Baseline: 4/5 ?Goal status: INITIAL ? ?3.  Patient to negotiate stairs with step through pattern ?Baseline: step to and sideways pending pain levels ?Goal status: INITIAL ? ?4.  Patient to demo good VMO/VL activation ratio ?Baseline:L VMO latency detected compared to VL ?Goal status: INITIAL ? ? ?PLAN: ?PT FREQUENCY: 1x/week ? ?PT DURATION: 6 weeks ? ?PLANNED INTERVENTIONS: Therapeutic exercises, Therapeutic activity, Neuromuscular re-education, Balance training, Gait training, Patient/Family education, Joint mobilization, Stair training, DME instructions, and Taping ? ?PLAN FOR NEXT SESSION: HEP update, L quad/VMO strengthening, L hip strengthening, CKC tasks ? ? ?Hildred Laser, PT ?11/25/2021, 6:43 PM  ?

## 2021-12-03 NOTE — Therapy (Signed)
?OUTPATIENT PHYSICAL THERAPY TREATMENT NOTE ? ? ?Patient Name: Heidi Mccormick ?MRN: [id number] ?DOB:[birth year], 21 y.o., female ?Today's Date: 12/04/2021 ? ?PCP: Jeoffrey Massed, MD ?REFERRING PROVIDER: Monica Becton, MD ?  ?END OF SESSION:  ? PT End of Session - 12/04/21 1735   ? ? Visit Number 2   ? Number of Visits 6   ? Date for PT Re-Evaluation 01/06/22   ? Authorization Type [place]   ? PT Start Time 1739   ? PT Stop Time 1821   ? PT Time Calculation (min) 42 min   ? Activity Tolerance Patient tolerated treatment well   ? Behavior During Therapy [facility] for tasks assessed/performed   ? ?  ?  ? ?  ? ? ?Past Medical History:  ?Diagnosis Date  ? Abnormal vaginal bleeding   ? Dr. Aldona Bar  ? Acne vulgaris   ? Headache syndrome   ? summer 2022  ? History of community acquired pneumonia 05/2021  ? Menometrorrhagia   ? Started [facility] 01/30/19  ? Vasovagal syncope   ? summer 2022  ? ?History reviewed. No pertinent surgical history. ?Patient Active Problem List  ? Diagnosis Date Noted  ? Chondromalacia of patellofemoral joint, left 11/10/2021  ? Migraine 05/29/2021  ? ? ?REFERRING DIAG: Muscle weakness (generalized) ?  ?Chronic pain of left knee ?  ?Chondromalacia of right patellofemoral joint ? ?THERAPY DIAG:  ?Muscle weakness (generalized) ? ?Chronic pain of left knee ? ?Chondromalacia of right patellofemoral joint ? ?PERTINENT HISTORY: Chondromalacia of patellofemoral joint, left ?Pleasant 21 year old female, works with Dr. Corliss Skains with rheumatology, over a year of pain left knee, retropatellar, worse with kneeling, going up and down stairs. ?On exam she has patellar crepitus, pain at the patellar facets, weak hip abductors and poor quadriceps definition. ?We discussed the anatomy and pathophysiology, she will do meloxicam, x-rays, formal physical therapy focusing on hip abductor's and vastus medialis, return to see me in 6 weeks, injection +/- MRI if not better. ?Of note she did try convertible workstation that  worsened her pain. ? ?PRECAUTIONS: None ? ?SUBJECTIVE: Patient reports HEP compliance and soreness from exercises. She states her pain has been aggravated recently by stair climbing.  ? ?PAIN:  ?Are you having pain? Yes: NPRS scale: 2/10 (when moving) ?Pain location: L knee ?Pain description: ache ?Aggravating factors: stairs, squatting ?Relieving factors: rest ? ? ?OBJECTIVE: (objective measures completed at initial evaluation unless otherwise dated) ? ? ?DIAGNOSTIC FINDINGS: none available ?  ?PATIENT SURVEYS:  ?FOTO 54(72 predicted) ?  ?COGNITION: ?          Overall cognitive status: Within functional limits for tasks assessed               ?           ?SENSATION: ?WFL ?  ?MUSCLE LENGTH: ?Hamstrings: Right 80 deg; Left 80 deg ?  ?  ?POSTURE:  ?Mild rounded shoulders ?  ?PALPATION: ?Retropatellar crepitus ?  ?LE ROM: WNL throughout ?  ?AROM Right ?11/25/2021 Left ?11/25/2021  ?Hip flexion      ?Hip extension      ?Hip abduction      ?Hip adduction      ?Hip internal rotation      ?Hip external rotation      ?Knee flexion      ?Knee extension      ?Ankle dorsiflexion      ?Ankle plantarflexion      ?Ankle inversion      ?Ankle eversion      ? (  Blank rows = not tested) ?  ?LE MMT: ?  ?MMT Right ?11/25/2021 Left ?11/25/2021  ?Hip flexion 5 4  ?Hip extension 5 4  ?Hip abduction 5 4  ?Hip adduction      ?Hip internal rotation      ?Hip external rotation      ?Knee flexion 5 4  ?Knee extension 5 4  ?Ankle dorsiflexion      ?Ankle plantarflexion      ?Ankle inversion      ?Ankle eversion      ? (Blank rows = not tested) ?  ?LOWER EXTREMITY SPECIAL TESTS:  ?Knee special tests: Patellafemoral apprehension test: negative, Patellafemoral grind test: positive , and Patella tap test (ballotable patella): positive  ?  ?FUNCTIONAL TESTS:  ?5 times sit to stand: 8s arms crossed ?L VMO latency detected compared to VL ?  ?GAIT: ?Distance walked: 5475ft x2 ?Assistive device utilized: None ?Level of assistance: Complete Independence ?  ?  ?   ?TODAY'S TREATMENT: ?[facility]                                                DATE: 12/04/2021 ?Therapeutic Exercise: ?Bike level 3 x 5 mins ?Standing hip abduction 2x10 BIL ?Standing hip extension 2x10 BIL ?Standing hamstring curl 2x10 BIL ?Squats on pball on wall, cues to not let knees fall inwards, 2x10 ?Step ups with knee drive 4" [id number] BIL ?Omega knee flexion 20# 2x10 ?Omega knee extension 5# 2x10 ?Omega leg press 25# 2x10 ?SLR with slight hip ER Lt only 2x10 ?Supine pilates ring squeeze 5" hold 2x10 ?Supine hamstring stretch with strap 2x30" BIL ? ? ?11/25/2021: Eval and HEP ?  ?  ?PATIENT EDUCATION:  ?Education details: Discussed eval findings, rehab rationale and POC and patient is in agreement  ?Person educated: Patient ?Education method: Explanation ?Education comprehension: verbalized understanding, returned demonstration, and needs further education ?  ?  ?HOME EXERCISE PROGRAM: ?Access Code: [email or web address] ?URL: [email or web address] ?Date: 11/25/2021 ?Prepared by: Gustavus BryantJeffrey Ziemba ?  ?Exercises ?- Supine Quad Set  - 2 x daily - 7 x weekly - 2 sets - 15 reps - 3s hold ?- Supine Quad Set  - 2 x daily - 7 x weekly - 2 sets - 15 reps - 3s hold ?- Bilateral Long Arc Quad  - 2 x daily - 7 x weekly - 2 sets - 15 reps - 3s hold ?- Sidelying Hip Abduction  - 2 x daily - 7 x weekly - 2 sets - 15 reps - 3s hold ?  ?ASSESSMENT: ?  ?CLINICAL IMPRESSION: ?Patient reports that she recently moved into a third floor apartment and has had some knee pain and instances of knee buckling navigating the stairs. She also endorses that her R knee has begun to bother her lately. She has crepitus in BIL knees with extension activities. Session today focused on L proximal hip and LE strengthening. Patient was able to tolerate all prescribed exercises with no adverse effects. Patient continues to benefit from skilled PT services and should be progressed as able to improve functional independence. ? ?  ? OBJECTIVE  IMPAIRMENTS decreased activity tolerance, decreased knowledge of condition, decreased strength, pain, and patella alta B .  ?  ?ACTIVITY LIMITATIONS community activity, occupation, and yard work.  ?  ?PERSONAL FACTORS Time since onset of injury/illness/exacerbation are also affecting patient's  functional outcome.  ?  ?  ?REHAB POTENTIAL: Good ?  ?CLINICAL DECISION MAKING: Stable/uncomplicated ?  ?EVALUATION COMPLEXITY: Low ?  ?  ?GOALS: ?Goals reviewed with patient? Yes ?  ?SHORT TERM GOALS: Target date: 12/09/2021 ?  ?Patient to demonstrate independence in HEP  ?Baseline:Access Code: [email or web address] ?Goal status: INITIAL ?  ?2.  Consider obtaining patellar tracking brace ?Baseline: TBD ?Goal status: INITIAL ?  ?  ?  ?LONG TERM GOALS: Target date: 01/06/2022 ?  ?Decrease pain to 2/10 at worst ?Baseline: 5/10 at worst ?Goal status: INITIAL ?  ?2.  Increase L knee/hip strength to 4+/5 ?Baseline: 4/5 ?Goal status: INITIAL ?  ?3.  Patient to negotiate stairs with step through pattern ?Baseline: step to and sideways pending pain levels ?Goal status: INITIAL ?  ?4.  Patient to demo good VMO/VL activation ratio ?Baseline:L VMO latency detected compared to VL ?Goal status: INITIAL ?  ?  ?PLAN: ?PT FREQUENCY: 1x/week ?  ?PT DURATION: 6 weeks ?  ?PLANNED INTERVENTIONS: Therapeutic exercises, Therapeutic activity, Neuromuscular re-education, Balance training, Gait training, Patient/Family education, Joint mobilization, Stair training, DME instructions, and Taping ?  ?PLAN FOR NEXT SESSION: HEP update, L quad/VMO strengthening, L hip strengthening, CKC tasks ? ? ? ?Harland German, PTA ?12/04/2021, 6:23 PM ? ?  ? ?

## 2021-12-04 ENCOUNTER — Ambulatory Visit: Payer: No Typology Code available for payment source

## 2021-12-04 DIAGNOSIS — M2241 Chondromalacia patellae, right knee: Secondary | ICD-10-CM

## 2021-12-04 DIAGNOSIS — M6281 Muscle weakness (generalized): Secondary | ICD-10-CM | POA: Diagnosis not present

## 2021-12-04 DIAGNOSIS — G8929 Other chronic pain: Secondary | ICD-10-CM

## 2021-12-10 ENCOUNTER — Ambulatory Visit: Payer: No Typology Code available for payment source

## 2021-12-10 DIAGNOSIS — M6281 Muscle weakness (generalized): Secondary | ICD-10-CM | POA: Diagnosis not present

## 2021-12-10 DIAGNOSIS — G8929 Other chronic pain: Secondary | ICD-10-CM

## 2021-12-10 DIAGNOSIS — M2241 Chondromalacia patellae, right knee: Secondary | ICD-10-CM

## 2021-12-10 NOTE — Therapy (Signed)
?OUTPATIENT PHYSICAL THERAPY TREATMENT NOTE ? ? ?Patient Name: Heidi Mccormick ?MRN: [id number] ?DOB:2001/02/12, 21 y.o., female ?Today's Date: 12/10/2021 ? ?PCP: Tammi Sou, MD ?REFERRING PROVIDER: Silverio Decamp, MD ?  ?END OF SESSION:  ? PT End of Session - 12/10/21 1742   ? ? Visit Number 3   ? Number of Visits 6   ? Date for PT Re-Evaluation 01/06/22   ? Authorization Type [place]   ? PT Start Time 1742   ? PT Stop Time [id number]   ? PT Time Calculation (min) 45 min   ? Activity Tolerance Patient tolerated treatment well   ? Behavior During Therapy [facility] for tasks assessed/performed   ? ?  ?  ? ?  ? ? ? ?Past Medical History:  ?Diagnosis Date  ? Abnormal vaginal bleeding   ? Dr. Stann Mainland  ? Acne vulgaris   ? Headache syndrome   ? summer 2022  ? History of community acquired pneumonia 05/2021  ? Menometrorrhagia   ? Started [facility] 01/30/19  ? Vasovagal syncope   ? summer 2022  ? ?History reviewed. No pertinent surgical history. ?Patient Active Problem List  ? Diagnosis Date Noted  ? Chondromalacia of patellofemoral joint, left 11/10/2021  ? Migraine 05/29/2021  ? ? ?REFERRING DIAG: Muscle weakness (generalized) ?  ?Chronic pain of left knee ?  ?Chondromalacia of right patellofemoral joint ? ?THERAPY DIAG:  ?Muscle weakness (generalized) ? ?Chronic pain of left knee ? ?Chondromalacia of right patellofemoral joint ? ?PERTINENT HISTORY: Chondromalacia of patellofemoral joint, left ?Pleasant 21 year old female, works with Dr. Estanislado Pandy with rheumatology, over a year of pain left knee, retropatellar, worse with kneeling, going up and down stairs. ?On exam she has patellar crepitus, pain at the patellar facets, weak hip abductors and poor quadriceps definition. ?We discussed the anatomy and pathophysiology, she will do meloxicam, x-rays, formal physical therapy focusing on hip abductor's and vastus medialis, return to see me in 6 weeks, injection +/- MRI if not better. ?Of note she did try convertible workstation that  worsened her pain. ? ?PRECAUTIONS: None ? ?SUBJECTIVE: Patient reports HEP compliance. She states she finds the stairs the most challenging. ? ?PAIN:  ?Are you having pain? Yes: NPRS scale: 3/10 (when moving, especially on stairs) ?Pain location: L knee ?Pain description: ache ?Aggravating factors: stairs, squatting ?Relieving factors: rest ? ? ?OBJECTIVE: (objective measures completed at initial evaluation unless otherwise dated) ? ? ?DIAGNOSTIC FINDINGS: none available ?  ?PATIENT SURVEYS:  ?FOTO 54(72 predicted) ?  ?COGNITION: ?          Overall cognitive status: Within functional limits for tasks assessed               ?           ?SENSATION: ?WFL ?  ?MUSCLE LENGTH: ?Hamstrings: Right 80 deg; Left 80 deg ?  ?  ?POSTURE:  ?Mild rounded shoulders ?  ?PALPATION: ?Retropatellar crepitus ?  ?LE ROM: WNL throughout ?  ?AROM Right ?11/25/2021 Left ?11/25/2021  ?Hip flexion      ?Hip extension      ?Hip abduction      ?Hip adduction      ?Hip internal rotation      ?Hip external rotation      ?Knee flexion      ?Knee extension      ?Ankle dorsiflexion      ?Ankle plantarflexion      ?Ankle inversion      ?Ankle eversion      ? (  Blank rows = not tested) ?  ?LE MMT: ?  ?MMT Right ?11/25/2021 Left ?11/25/2021  ?Hip flexion 5 4  ?Hip extension 5 4  ?Hip abduction 5 4  ?Hip adduction      ?Hip internal rotation      ?Hip external rotation      ?Knee flexion 5 4  ?Knee extension 5 4  ?Ankle dorsiflexion      ?Ankle plantarflexion      ?Ankle inversion      ?Ankle eversion      ? (Blank rows = not tested) ?  ?LOWER EXTREMITY SPECIAL TESTS:  ?Knee special tests: Patellafemoral apprehension test: negative, Patellafemoral grind test: positive , and Patella tap test (ballotable patella): positive  ?  ?FUNCTIONAL TESTS:  ?5 times sit to stand: 8s arms crossed ?L VMO latency detected compared to VL ?  ?GAIT: ?Distance walked: 37ft x2 ?Assistive device utilized: None ?Level of assistance: Complete Independence ?  ?  ?  ?TODAY'S  TREATMENT: ?[facility] Adult PT Treatment:                                                DATE: 12/10/2021 ?Therapeutic Exercise: ?Bike level 3 x 5 mins ?Cybex hip abduction 17.5# 2x10 BIL (moved to 12.5# for second set) ?Standing hip extension 12.5# 2x10 BIL ?Step ups with knee drive 4", 6", 8" [id number] ?Step downs heel taps 2" 2x10 BIL (cues to not let R hip drop and keep pelvis level) ?Omega knee flexion 20# 2x10 ?Omega knee extension 5# x10 (terminated 2nd set, sharp increase in pain) ?Omega leg press 25# 2x10 ? ? ?[facility] Adult PT Treatment:                                                DATE: 12/04/2021 ?Therapeutic Exercise: ?Bike level 3 x 5 mins ?Standing hip abduction 2x10 BIL ?Standing hip extension 2x10 BIL ?Standing hamstring curl 2x10 BIL ?Squats on pball on wall, cues to not let knees fall inwards, 2x10 ?Step ups with knee drive 4" [id number] BIL ?Omega knee flexion 20# 2x10 ?Omega knee extension 5# 2x10 ?Omega leg press 25# 2x10 ?SLR with slight hip ER Lt only 2x10 ?Supine pilates ring squeeze 5" hold 2x10 ?Supine hamstring stretch with strap 2x30" BIL ? ? ?11/25/2021: Eval and HEP ?  ?  ?PATIENT EDUCATION:  ?Education details: Discussed eval findings, rehab rationale and POC and patient is in agreement  ?Person educated: Patient ?Education method: Explanation ?Education comprehension: verbalized understanding, returned demonstration, and needs further education ?  ?  ?HOME EXERCISE PROGRAM: ?Access Code: [email or web address] ?URL: [email or web address] ?Date: 11/25/2021 ?Prepared by: Sharlynn Oliphant ?  ?Exercises ?- Supine Quad Set  - 2 x daily - 7 x weekly - 2 sets - 15 reps - 3s hold ?- Supine Quad Set  - 2 x daily - 7 x weekly - 2 sets - 15 reps - 3s hold ?- Bilateral Long Arc Quad  - 2 x daily - 7 x weekly - 2 sets - 15 reps - 3s hold ?- Sidelying Hip Abduction  - 2 x daily - 7 x weekly - 2 sets - 15 reps - 3s hold ?  ?ASSESSMENT: ?  ?CLINICAL IMPRESSION: ?Patient presents to PT  with no current pain in knees and  states she has the most trouble still with stair climbing reporting up to 3/10 pain and she states that they can be difficult. Session today focused on proximal hip and LE strengthening, particular focus on stair climbing mechanics. She has a sharp increase in pain with open chain knee extension on machine even with minimal weight, terminated this exercise. Patient continues to benefit from skilled PT services and should be progressed as able to improve functional independence. ? ?  ? OBJECTIVE IMPAIRMENTS decreased activity tolerance, decreased knowledge of condition, decreased strength, pain, and patella alta B .  ?  ?ACTIVITY LIMITATIONS community activity, occupation, and yard work.  ?  ?PERSONAL FACTORS Time since onset of injury/illness/exacerbation are also affecting patient's functional outcome.  ?  ?  ?REHAB POTENTIAL: Good ?  ?CLINICAL DECISION MAKING: Stable/uncomplicated ?  ?EVALUATION COMPLEXITY: Low ?  ?  ?GOALS: ?Goals reviewed with patient? Yes ?  ?SHORT TERM GOALS: Target date: 12/09/2021 ?  ?Patient to demonstrate independence in HEP  ?Baseline:Access Code: [email or web address] ?Goal status: INITIAL ?  ?2.  Consider obtaining patellar tracking brace ?Baseline: TBD ?Goal status: INITIAL ?  ?  ?  ?LONG TERM GOALS: Target date: 01/06/2022 ?  ?Decrease pain to 2/10 at worst ?Baseline: 5/10 at worst ?Goal status: INITIAL ?  ?2.  Increase L knee/hip strength to 4+/5 ?Baseline: 4/5 ?Goal status: INITIAL ?  ?3.  Patient to negotiate stairs with step through pattern ?Baseline: step to and sideways pending pain levels ?Goal status: INITIAL ?  ?4.  Patient to demo good VMO/VL activation ratio ?Baseline:L VMO latency detected compared to VL ?Goal status: INITIAL ?  ?  ?PLAN: ?PT FREQUENCY: 1x/week ?  ?PT DURATION: 6 weeks ?  ?PLANNED INTERVENTIONS: Therapeutic exercises, Therapeutic activity, Neuromuscular re-education, Balance training, Gait training, Patient/Family education, Joint mobilization, Stair training, DME  instructions, and Taping ?  ?PLAN FOR NEXT SESSION: HEP update, L quad/VMO strengthening, L hip strengthening, CKC tasks ? ? ? ?Evelene Croon, PTA ?12/10/2021, 5:47 PM ? ?  ? ?

## 2021-12-18 ENCOUNTER — Ambulatory Visit: Payer: No Typology Code available for payment source

## 2021-12-23 ENCOUNTER — Ambulatory Visit (INDEPENDENT_AMBULATORY_CARE_PROVIDER_SITE_OTHER): Payer: No Typology Code available for payment source | Admitting: Sports Medicine

## 2021-12-23 DIAGNOSIS — M2242 Chondromalacia patellae, left knee: Secondary | ICD-10-CM

## 2021-12-23 NOTE — Assessment & Plan Note (Signed)
Pleasant 21 year old female, works with Dr. Dimas Alexandria with rheumatology, she had a year of knee pain, retropatellar, worse going up and down stairs, kneeling, all patellofemoral symptomatology, she did have some patellar crepitus, we talked about the anatomy and pathophysiology, she did aggressive formal physical therapy and returns today for the most part pain-free and able to go up and down stairs. X-rays were unrevealing with the exception of an incidentally noted right tibial enchondroma. Continue conditioning indefinitely and return to see me as needed.

## 2021-12-23 NOTE — Progress Notes (Signed)
    Procedures performed today:    None.  Independent interpretation of notes and tests performed by another provider:   None.  Brief History, Exam, Impression, and Recommendations:    Chondromalacia of patellofemoral joint, left Pleasant 21 year old female, works with Dr. Tollie Eth with rheumatology, she had a year of knee pain, retropatellar, worse going up and down stairs, kneeling, all patellofemoral symptomatology, she did have some patellar crepitus, we talked about the anatomy and pathophysiology, she did aggressive formal physical therapy and returns today for the most part pain-free and able to go up and down stairs. X-rays were unrevealing with the exception of an incidentally noted right tibial enchondroma. Continue conditioning indefinitely and return to see me as needed.    ___________________________________________ Ihor Austin. Benjamin Stain, M.D., ABFM., CAQSM. Primary Care and Sports Medicine [facility]  Adjunct Instructor of Family Medicine  [employer]

## 2021-12-24 ENCOUNTER — Ambulatory Visit: Payer: No Typology Code available for payment source

## 2021-12-24 DIAGNOSIS — M6281 Muscle weakness (generalized): Secondary | ICD-10-CM | POA: Diagnosis not present

## 2021-12-24 DIAGNOSIS — M2241 Chondromalacia patellae, right knee: Secondary | ICD-10-CM

## 2021-12-24 DIAGNOSIS — G8929 Other chronic pain: Secondary | ICD-10-CM

## 2021-12-24 NOTE — Therapy (Signed)
OUTPATIENT PHYSICAL THERAPY TREATMENT NOTE   Patient Name: Heidi Mccormick MRN: [id number] DOB:09-Dec-2000, 21 y.o., female Today's Date: 12/24/2021  PCP: Tammi Sou, MD REFERRING PROVIDER: Silverio Decamp, MD   END OF SESSION:   PT End of Session - 12/24/21 1740     Visit Number 4    Number of Visits 6    Date for PT Re-Evaluation 01/06/22    Authorization Type [employer]    PT Start Time 1740    PT Stop Time 1820    PT Time Calculation (min) 40 min    Activity Tolerance Patient tolerated treatment well    Behavior During Therapy [facility] for tasks assessed/performed              Past Medical History:  Diagnosis Date   Abnormal vaginal bleeding    Dr. Stann Mainland   Acne vulgaris    Headache syndrome    summer 2022   History of community acquired pneumonia 05/2021   Menometrorrhagia    Started Yaz 01/30/19   Vasovagal syncope    summer 2022   History reviewed. No pertinent surgical history. Patient Active Problem List   Diagnosis Date Noted   Chondromalacia of patellofemoral joint, left 11/10/2021   Migraine 05/29/2021    REFERRING DIAG: Muscle weakness (generalized)   Chronic pain of left knee   Chondromalacia of right patellofemoral joint  THERAPY DIAG:  Muscle weakness (generalized)  Chronic pain of left knee  Chondromalacia of right patellofemoral joint  PERTINENT HISTORY: Chondromalacia of patellofemoral joint, left Pleasant 21 year old female, works with Dr. Estanislado Pandy with rheumatology, over a year of pain left knee, retropatellar, worse with kneeling, going up and down stairs. On exam she has patellar crepitus, pain at the patellar facets, weak hip abductors and poor quadriceps definition. We discussed the anatomy and pathophysiology, she will do meloxicam, x-rays, formal physical therapy focusing on hip abductor's and vastus medialis, return to see me in 6 weeks, injection +/- MRI if not better. Of note she did try convertible workstation that  worsened her pain.  PRECAUTIONS: None  SUBJECTIVE: Knee pain less, stairs easier and more confident, crepitus has softened  PAIN:  Are you having pain? Yes: NPRS scale: 3/10 (when moving, especially on stairs) Pain location: L knee Pain description: ache Aggravating factors: stairs, squatting Relieving factors: rest   OBJECTIVE: (objective measures completed at initial evaluation unless otherwise dated)   DIAGNOSTIC FINDINGS: none available   PATIENT SURVEYS:  FOTO 54(72 predicted)   COGNITION:           Overall cognitive status: Within functional limits for tasks assessed                          SENSATION: WFL   MUSCLE LENGTH: Hamstrings: Right 80 deg; Left 80 deg     POSTURE:  Mild rounded shoulders   PALPATION: Retropatellar crepitus   LE ROM: WNL throughout   AROM Right 11/25/2021 Left 11/25/2021  Hip flexion      Hip extension      Hip abduction      Hip adduction      Hip internal rotation      Hip external rotation      Knee flexion      Knee extension      Ankle dorsiflexion      Ankle plantarflexion      Ankle inversion      Ankle eversion       (  Blank rows = not tested)   LE MMT:   MMT Right 11/25/2021 Left 11/25/2021  Hip flexion 5 4  Hip extension 5 4  Hip abduction 5 4  Hip adduction      Hip internal rotation      Hip external rotation      Knee flexion 5 4  Knee extension 5 4  Ankle dorsiflexion      Ankle plantarflexion      Ankle inversion      Ankle eversion       (Blank rows = not tested)   LOWER EXTREMITY SPECIAL TESTS:  Knee special tests: Patellafemoral apprehension test: negative, Patellafemoral grind test: positive , and Patella tap test (ballotable patella): positive    FUNCTIONAL TESTS:  5 times sit to stand: 8s arms crossed L VMO latency detected compared to VL   GAIT: Distance walked: 36f x2 Assistive device utilized: None Level of assistance: Complete Independence       TODAY'S TREATMENT: [facility] Adult PT  Treatment:                                                DATE: 12/24/21 Therapeutic Exercise: Retrowalk 15% grade 1.5 MPH 8 min QS's 15x L  SLR 15x L Side lie abduction L 15x SL bridge L 15x SL heel raise L FAQ with adduction 15x Clams BluTB 15x TKE BluTB   [facility] Adult PT Treatment:                                                DATE: 12/10/2021 Therapeutic Exercise: Bike level 3 x 5 mins Cybex hip abduction 17.5# 2x10 BIL (moved to 12.5# for second set) Standing hip extension 12.5# 2x10 BIL Step ups with knee drive 4", 6", 8" [id number] downs heel taps 2" 2x10 BIL (cues to not let R hip drop and keep pelvis level) Omega knee flexion 20# 2x10 Omega knee extension 5# x10 (terminated 2nd set, sharp increase in pain) Omega leg press 25# 2x10   [facility] Adult PT Treatment:                                                DATE: 12/04/2021 Therapeutic Exercise: Bike level 3 x 5 mins Standing hip abduction 2x10 BIL Standing hip extension 2x10 BIL Standing hamstring curl 2x10 BIL Squats on pball on wall, cues to not let knees fall inwards, 2x10 Step ups with knee drive 4" [id number] Omega knee flexion 20# 2x10 Omega knee extension 5# 2x10 Omega leg press 25# 2x10 SLR with slight hip ER Lt only 2x10 Supine pilates ring squeeze 5" hold 2x10 Supine hamstring stretch with strap 2x30" BIL   11/25/2021: Eval and HEP     PATIENT EDUCATION:  Education details: Discussed eval findings, rehab rationale and POC and patient is in agreement  Person educated: Patient Education method: Explanation Education comprehension: verbalized understanding, returned demonstration, and needs further education     HOME EXERCISE PROGRAM: Access Code: [email or web address]: [email or web address] Date: 12/24/2021 Prepared by: JSharlynn Oliphant Exercises - Supine Quad Set  - 2 x  daily - 7 x weekly - 2 sets - 15 reps - 3s hold - Bilateral Long Arc Quad  - 2 x daily - 7 x weekly - 2 sets - 15 reps - 3s  hold - Sidelying Hip Abduction  - 2 x daily - 7 x weekly - 2 sets - 15 reps - 3s hold - Figure 4 Bridge  - 2 x daily - 7 x weekly - 2 sets - 15 reps - Standing Terminal Knee Extension with Resistance  - 2 x daily - 7 x weekly - 2 sets - 15 reps   ASSESSMENT:   CLINICAL IMPRESSION: Today's focus was TKE adding retrowalking, supine tasks building on HEP.  Emphasis placed on VMO and hip abductors, (GM).  Applied medial patellar glide McConnell technique which provided stability and pain relief.  Discussed rationale for tape as well as precautions.  Taping applied to L knee.  Patient instructed to remove tape with any increased pain, skin irritation or in the event the tape loosens and can not be re-applied.       OBJECTIVE IMPAIRMENTS decreased activity tolerance, decreased knowledge of condition, decreased strength, pain, and patella alta B .    ACTIVITY LIMITATIONS community activity, occupation, and yard work.    PERSONAL FACTORS Time since onset of injury/illness/exacerbation are also affecting patient's functional outcome.      REHAB POTENTIAL: Good   CLINICAL DECISION MAKING: Stable/uncomplicated   EVALUATION COMPLEXITY: Low     GOALS: Goals reviewed with patient? Yes   SHORT TERM GOALS: Target date: 12/09/2021   Patient to demonstrate independence in HEP  Baseline:Access Code: [email or web address] Goal status: revised and met   2.  Consider obtaining patellar tracking brace Baseline: TBD; Discussed Goal status: Met       LONG TERM GOALS: Target date: 01/06/2022   Decrease pain to 2/10 at worst Baseline: 5/10 at worst Goal status: INITIAL   2.  Increase L knee/hip strength to 4+/5 Baseline: 4/5 Goal status: INITIAL   3.  Patient to negotiate stairs with step through pattern Baseline: step to and sideways pending pain levels Goal status: INITIAL   4.  Patient to demo good VMO/VL activation ratio Baseline:L VMO latency detected compared to VL Goal status: INITIAL      PLAN: PT FREQUENCY: 1x/week   PT DURATION: 6 weeks   PLANNED INTERVENTIONS: Therapeutic exercises, Therapeutic activity, Neuromuscular re-education, Balance training, Gait training, Patient/Family education, Joint mobilization, Stair training, DME instructions, and Taping   PLAN FOR NEXT SESSION: HEP update, L quad/VMO strengthening, L hip strengthening, CKC tasks, taping benefit?    Lanice Shirts, PT 12/24/2021, 6:25 PM

## 2021-12-31 ENCOUNTER — Ambulatory Visit: Payer: No Typology Code available for payment source | Attending: Sports Medicine

## 2021-12-31 DIAGNOSIS — M25562 Pain in left knee: Secondary | ICD-10-CM | POA: Insufficient documentation

## 2021-12-31 DIAGNOSIS — M2241 Chondromalacia patellae, right knee: Secondary | ICD-10-CM | POA: Insufficient documentation

## 2021-12-31 DIAGNOSIS — G8929 Other chronic pain: Secondary | ICD-10-CM | POA: Diagnosis present

## 2021-12-31 DIAGNOSIS — M6281 Muscle weakness (generalized): Secondary | ICD-10-CM | POA: Diagnosis present

## 2021-12-31 NOTE — Therapy (Signed)
OUTPATIENT PHYSICAL THERAPY TREATMENT NOTE   Patient Name: Heidi Mccormick MRN: [id number] DOB:19-Mar-2001, 21 y.o., female Today's Date: 12/31/2021  PCP: Tammi Sou, MD REFERRING PROVIDER: Silverio Decamp, MD   END OF SESSION:   PT End of Session - 12/31/21 1737     Visit Number 5    Number of Visits 6    Date for PT Re-Evaluation 01/06/22    Authorization Type [employer]    PT Start Time 1745    PT Stop Time 1830    PT Time Calculation (min) 45 min    Activity Tolerance Patient tolerated treatment well    Behavior During Therapy [facility] for tasks assessed/performed               Past Medical History:  Diagnosis Date   Abnormal vaginal bleeding    Dr. Stann Mainland   Acne vulgaris    Headache syndrome    summer 2022   History of community acquired pneumonia 05/2021   Menometrorrhagia    Started Yaz 01/30/19   Vasovagal syncope    summer 2022   History reviewed. No pertinent surgical history. Patient Active Problem List   Diagnosis Date Noted   Chondromalacia of patellofemoral joint, left 11/10/2021   Migraine 05/29/2021    REFERRING DIAG: Muscle weakness (generalized)   Chronic pain of left knee   Chondromalacia of right patellofemoral joint  THERAPY DIAG:  Muscle weakness (generalized)  Chronic pain of left knee  Chondromalacia of right patellofemoral joint  PERTINENT HISTORY: Chondromalacia of patellofemoral joint, left Pleasant 21 year old female, works with Dr. Estanislado Pandy with rheumatology, over a year of pain left knee, retropatellar, worse with kneeling, going up and down stairs. On exam she has patellar crepitus, pain at the patellar facets, weak hip abductors and poor quadriceps definition. We discussed the anatomy and pathophysiology, she will do meloxicam, x-rays, formal physical therapy focusing on hip abductor's and vastus medialis, return to see me in 6 weeks, injection +/- MRI if not better. Of note she did try convertible workstation that  worsened her pain.  PRECAUTIONS: None  SUBJECTIVE: Patient reports her knee is achy today. She reports the taping helped and she was able to climb the stairs easier.   PAIN:  Are you having pain? Yes: NPRS scale: 3/10 (when moving, especially on stairs) Pain location: L knee Pain description: ache Aggravating factors: stairs, squatting Relieving factors: rest   OBJECTIVE: (objective measures completed at initial evaluation unless otherwise dated)   DIAGNOSTIC FINDINGS: none available   PATIENT SURVEYS:  FOTO 54(72 predicted)   COGNITION:           Overall cognitive status: Within functional limits for tasks assessed                          SENSATION: WFL   MUSCLE LENGTH: Hamstrings: Right 80 deg; Left 80 deg     POSTURE:  Mild rounded shoulders   PALPATION: Retropatellar crepitus   LE ROM: WNL throughout   AROM Right 11/25/2021 Left 11/25/2021  Hip flexion      Hip extension      Hip abduction      Hip adduction      Hip internal rotation      Hip external rotation      Knee flexion      Knee extension      Ankle dorsiflexion      Ankle plantarflexion      Ankle inversion  Ankle eversion       (Blank rows = not tested)   LE MMT:   MMT Right 11/25/2021 Left 11/25/2021  Hip flexion 5 4  Hip extension 5 4  Hip abduction 5 4  Hip adduction      Hip internal rotation      Hip external rotation      Knee flexion 5 4  Knee extension 5 4  Ankle dorsiflexion      Ankle plantarflexion      Ankle inversion      Ankle eversion       (Blank rows = not tested)   LOWER EXTREMITY SPECIAL TESTS:  Knee special tests: Patellafemoral apprehension test: negative, Patellafemoral grind test: positive , and Patella tap test (ballotable patella): positive    FUNCTIONAL TESTS:  5 times sit to stand: 8s arms crossed L VMO latency detected compared to VL   GAIT: Distance walked: 71f x2 Assistive device utilized: None Level of assistance: Complete Independence        TODAY'S TREATMENT: [facility] Adult PT Treatment:                                                DATE: 12/31/2021 Therapeutic Exercise: Retrowalk 15% grade 1.5 MPH 8 min  SLR 2x10 L Side lie abduction L 2x10 FAQ with adduction 2x10 Clams BluTB 2x10 TKE BluTB x15 Omega knee flexion 25# 2x10 Omega leg press 25# 2x10   [facility] Adult PT Treatment:                                                DATE: 12/24/21 Therapeutic Exercise: Retrowalk 15% grade 1.5 MPH 8 min QS's 15x L  SLR 15x L Side lie abduction L 15x SL bridge L 15x SL heel raise L FAQ with adduction 15x Clams BluTB 15x TKE BluTB   [facility] Adult PT Treatment:                                                DATE: 12/10/2021 Therapeutic Exercise: Bike level 3 x 5 mins Cybex hip abduction 17.5# 2x10 BIL (moved to 12.5# for second set) Standing hip extension 12.5# 2x10 BIL Step ups with knee drive 4", 6", 8" [id number] downs heel taps 2" 2x10 BIL (cues to not let R hip drop and keep pelvis level) Omega knee flexion 20# 2x10 Omega knee extension 5# x10 (terminated 2nd set, sharp increase in pain) Omega leg press 25# 2x10   [facility] Adult PT Treatment:                                                DATE: 12/04/2021 Therapeutic Exercise: Bike level 3 x 5 mins Standing hip abduction 2x10 BIL Standing hip extension 2x10 BIL Standing hamstring curl 2x10 BIL Squats on pball on wall, cues to not let knees fall inwards, 2x10 Step ups with knee drive 4" [id number] Omega knee flexion 20# 2x10 Omega knee extension  5# 2x10 Omega leg press 25# 2x10 SLR with slight hip ER Lt only 2x10 Supine pilates ring squeeze 5" hold 2x10 Supine hamstring stretch with strap 2x30" BIL     PATIENT EDUCATION:  Education details: Discussed eval findings, rehab rationale and POC and patient is in agreement  Person educated: Patient Education method: Explanation Education comprehension: verbalized understanding, returned demonstration, and needs further  education     HOME EXERCISE PROGRAM: Access Code: [email or web address] URL: [email or web address] Date: 12/24/2021 Prepared by: Sharlynn Oliphant  Exercises - Supine Quad Set  - 2 x daily - 7 x weekly - 2 sets - 15 reps - 3s hold - Bilateral Long Arc Quad  - 2 x daily - 7 x weekly - 2 sets - 15 reps - 3s hold - Sidelying Hip Abduction  - 2 x daily - 7 x weekly - 2 sets - 15 reps - 3s hold - Figure 4 Bridge  - 2 x daily - 7 x weekly - 2 sets - 15 reps - Standing Terminal Knee Extension with Resistance  - 2 x daily - 7 x weekly - 2 sets - 15 reps   ASSESSMENT:   CLINICAL IMPRESSION: Patient present to PT with continued knee pain, L>R. Session today focused on LE strengthening, particularly quadriceps. She reports that stairs are getting easier. Applied medial patellar glide McConnell technique which provided stability and pain relief.  Discussed rationale for tape as well as precautions. Taping applied to L knee.  Patient instructed to remove tape with any increased pain, skin irritation or in the event the tape loosens and can not be re-applied. Patient was able to tolerate all prescribed exercises with no adverse effects. Patient continues to benefit from skilled PT services and should be progressed as able to improve functional independence.      OBJECTIVE IMPAIRMENTS decreased activity tolerance, decreased knowledge of condition, decreased strength, pain, and patella alta B .    ACTIVITY LIMITATIONS community activity, occupation, and yard work.    PERSONAL FACTORS Time since onset of injury/illness/exacerbation are also affecting patient's functional outcome.      REHAB POTENTIAL: Good   CLINICAL DECISION MAKING: Stable/uncomplicated   EVALUATION COMPLEXITY: Low     GOALS: Goals reviewed with patient? Yes   SHORT TERM GOALS: Target date: 12/09/2021   Patient to demonstrate independence in HEP  Baseline:Access Code: [email or web address] Goal status: revised and met   2.  Consider  obtaining patellar tracking brace Baseline: TBD; Discussed Goal status: Met       LONG TERM GOALS: Target date: 01/06/2022   Decrease pain to 2/10 at worst Baseline: 5/10 at worst Goal status: INITIAL   2.  Increase L knee/hip strength to 4+/5 Baseline: 4/5 Goal status: INITIAL   3.  Patient to negotiate stairs with step through pattern Baseline: step to and sideways pending pain levels Goal status: INITIAL   4.  Patient to demo good VMO/VL activation ratio Baseline:L VMO latency detected compared to VL Goal status: INITIAL     PLAN: PT FREQUENCY: 1x/week   PT DURATION: 6 weeks   PLANNED INTERVENTIONS: Therapeutic exercises, Therapeutic activity, Neuromuscular re-education, Balance training, Gait training, Patient/Family education, Joint mobilization, Stair training, DME instructions, and Taping   PLAN FOR NEXT SESSION: HEP update, L quad/VMO strengthening, L hip strengthening, CKC tasks, taping benefit?    Evelene Croon, PTA 12/31/2021, 5:41 PM

## 2022-01-07 ENCOUNTER — Ambulatory Visit: Payer: No Typology Code available for payment source

## 2022-01-07 DIAGNOSIS — M25562 Pain in left knee: Secondary | ICD-10-CM

## 2022-01-07 DIAGNOSIS — M6281 Muscle weakness (generalized): Secondary | ICD-10-CM | POA: Diagnosis not present

## 2022-01-07 DIAGNOSIS — M2241 Chondromalacia patellae, right knee: Secondary | ICD-10-CM

## 2022-01-07 NOTE — Therapy (Signed)
OUTPATIENT PHYSICAL THERAPY TREATMENT NOTE/DC SUMMARY   Patient Name: Heidi Mccormick MRN: [id number] DOB:01/28/2001, 21 y.o., female Today's Date: 01/07/2022  PCP: Tammi Sou, MD REFERRING PROVIDER: Silverio Decamp, MD  PHYSICAL THERAPY DISCHARGE SUMMARY  Visits from Start of Care: 6  Current functional level related to goals / functional outcomes: Patient ready for self management   Remaining deficits: Strength and pain   Education / Equipment: HEP   Patient agrees to discharge. Patient goals were partially met. Patient is being discharged due to being pleased with the current functional level.  END OF SESSION:   PT End of Session - 01/07/22 1743     Visit Number 6    Number of Visits 6    Date for PT Re-Evaluation 01/06/22    Authorization Type [employer]    PT Start Time 1745    PT Stop Time 1825    PT Time Calculation (min) 40 min    Activity Tolerance Patient tolerated treatment well    Behavior During Therapy [facility] for tasks assessed/performed               Past Medical History:  Diagnosis Date   Abnormal vaginal bleeding    Dr. Stann Mainland   Acne vulgaris    Headache syndrome    summer 2022   History of community acquired pneumonia 05/2021   Menometrorrhagia    Started Yaz 01/30/19   Vasovagal syncope    summer 2022   History reviewed. No pertinent surgical history. Patient Active Problem List   Diagnosis Date Noted   Chondromalacia of patellofemoral joint, left 11/10/2021   Migraine 05/29/2021    REFERRING DIAG: Muscle weakness (generalized)   Chronic pain of left knee   Chondromalacia of right patellofemoral joint  THERAPY DIAG:  Muscle weakness (generalized)  Chronic pain of left knee  Chondromalacia of right patellofemoral joint  PERTINENT HISTORY: Chondromalacia of patellofemoral joint, left Pleasant 21 year old female, works with Dr. Estanislado Pandy with rheumatology, over a year of pain left knee, retropatellar, worse with  kneeling, going up and down stairs. On exam she has patellar crepitus, pain at the patellar facets, weak hip abductors and poor quadriceps definition. We discussed the anatomy and pathophysiology, she will do meloxicam, x-rays, formal physical therapy focusing on hip abductor's and vastus medialis, return to see me in 6 weeks, injection +/- MRI if not better. Of note she did try convertible workstation that worsened her pain.  PRECAUTIONS: None  SUBJECTIVE: Reports improving symptoms, still struggles with squatting and steps.  Has been working out at apartment and feels she is able to manage her symptoms.  PAIN:  Are you having pain? Yes: NPRS scale: 3/10 (when moving, especially on stairs) Pain location: L knee Pain description: ache Aggravating factors: stairs, squatting Relieving factors: rest   OBJECTIVE: (objective measures completed at initial evaluation unless otherwise dated)   DIAGNOSTIC FINDINGS: none available   PATIENT SURVEYS:  FOTO 54(72 predicted); 01/07/22 65   COGNITION:           Overall cognitive status: Within functional limits for tasks assessed                          SENSATION: WFL   MUSCLE LENGTH: Hamstrings: Right 80 deg; Left 80 deg     POSTURE:  Mild rounded shoulders   PALPATION: Retropatellar crepitus   LE ROM: WNL throughout   AROM Right 11/25/2021 Left 11/25/2021  Hip flexion  Hip extension      Hip abduction      Hip adduction      Hip internal rotation      Hip external rotation      Knee flexion      Knee extension      Ankle dorsiflexion      Ankle plantarflexion      Ankle inversion      Ankle eversion       (Blank rows = not tested)   LE MMT:   MMT Right 11/25/2021 Left 11/25/2021  Hip flexion 5 4  Hip extension 5 4  Hip abduction 5 4  Hip adduction      Hip internal rotation      Hip external rotation      Knee flexion 5 4  Knee extension 5 4  Ankle dorsiflexion      Ankle plantarflexion      Ankle inversion       Ankle eversion       (Blank rows = not tested)   LOWER EXTREMITY SPECIAL TESTS:  Knee special tests: Patellafemoral apprehension test: negative, Patellafemoral grind test: positive , and Patella tap test (ballotable patella): positive    FUNCTIONAL TESTS:  5 times sit to stand: 8s arms crossed L VMO latency detected compared to VL   GAIT: Distance walked: 2f x2 Assistive device utilized: None Level of assistance: Complete Independence       TODAY'S TREATMENT: [facility] Adult PT Treatment:                                                DATE: 01/07/22 Therapeutic Exercise: Retrowalk 15% grade 1.5 MPH 8 min  SLR L 5# 15x L abduction 15x  Single leg reach 15x Quad set 15x Self Care: Discussed HEP and refined and reviewed as well.  Discussed strategies for self management going forward  [facility]                                                [facility]: [facility] Therapeutic Exercise: Retrowalk 15% grade 1.5 MPH 8 min  SLR 2x10 L Side lie abduction L 2x10 FAQ with adduction 2x10 Clams BluTB 2x10 TKE BluTB x15 Omega knee flexion 25# 2x10 Omega leg press 25# 2x10   [facility] Adult PT Treatment:                                                DATE: 12/24/21 Therapeutic Exercise: Retrowalk 15% grade 1.5 MPH 8 min QS's 15x L  SLR 15x L Side lie abduction L 15x SL bridge L 15x SL heel raise L FAQ with adduction 15x Clams BluTB 15x TKE BluTB      PATIENT EDUCATION:  Education details: Discussed eval findings, rehab rationale and POC and patient is in agreement  Person educated: Patient Education method: Explanation Education comprehension: verbalized understanding, returned demonstration, and needs further education     HOME EXERCISE PROGRAM: Access Code: [email or web address]: [email or web address] Date: 01/07/2022 Prepared by: JSharlynn Oliphant Exercises - Supine Quad Set  - 2 x daily -  7 x weekly - 2 sets - 15 reps - 3s hold - Supine Straight Leg  Raises  - 2 x daily - 7 x weekly - 3 sets - 10 reps - 30s hold - Forward Step Down  - 2 x daily - 7 x weekly - 3 sets - 10 reps - 30s hold   ASSESSMENT:   CLINICAL IMPRESSION: Patient reports improved pain and function, still struggles with pain negotiating stairs and arising from low seating, especially early in th day.  Goals partially met ad progress made.  Patient feels ready for self management and clinician in agreement.  VMO/VL ratio improved     OBJECTIVE IMPAIRMENTS decreased activity tolerance, decreased knowledge of condition, decreased strength, pain, and patella alta B .    ACTIVITY LIMITATIONS community activity, occupation, and yard work.    PERSONAL FACTORS Time since onset of injury/illness/exacerbation are also affecting patient's functional outcome.      REHAB POTENTIAL: Good   CLINICAL DECISION MAKING: Stable/uncomplicated   EVALUATION COMPLEXITY: Low     GOALS: Goals reviewed with patient? Yes   SHORT TERM GOALS: Target date: 12/09/2021   Patient to demonstrate independence in HEP  Baseline:Access Code: [email or web address] Goal status: revised and met   2.  Consider obtaining patellar tracking brace Baseline: TBD; Discussed Goal status: Met       LONG TERM GOALS: Target date: 01/06/2022   Decrease pain to 2/10 at worst Baseline: 5/10 at worst; 01/07/22 1/10 pain with ADLs Goal status: Met    2.  Increase L knee/hip strength to 4+/5 Baseline: 4/5; 01/07/22 4+/5 L knee /hip strength  Goal status: Met   3.  Patient to negotiate stairs with step through pattern Baseline: step to and sideways pending pain levels; 01/07/22 Unchanged Goal status: Partially met   4.  Patient to demo good VMO/VL activation ratio Baseline:L VMO latency detected compared to VL; Improved  Goal status: Met     PLAN: PT FREQUENCY: 1x/week   PT DURATION: 6 weeks   PLANNED INTERVENTIONS: Therapeutic exercises, Therapeutic activity, Neuromuscular re-education, Balance training, Gait  training, Patient/Family education, Joint mobilization, Stair training, DME instructions, and Taping   PLAN FOR NEXT SESSION: DC to self management     Lanice Shirts, PT 01/07/2022, 5:44 PM

## 2022-01-12 ENCOUNTER — Encounter: Payer: Self-pay | Admitting: Sports Medicine

## 2022-01-14 ENCOUNTER — Ambulatory Visit: Payer: No Typology Code available for payment source

## 2022-01-15 ENCOUNTER — Other Ambulatory Visit (HOSPITAL_COMMUNITY): Payer: Self-pay

## 2022-01-23 ENCOUNTER — Ambulatory Visit (INDEPENDENT_AMBULATORY_CARE_PROVIDER_SITE_OTHER): Payer: No Typology Code available for payment source | Admitting: Family Medicine

## 2022-01-23 ENCOUNTER — Encounter: Payer: Self-pay | Admitting: Family Medicine

## 2022-01-23 ENCOUNTER — Other Ambulatory Visit (HOSPITAL_COMMUNITY): Payer: Self-pay

## 2022-01-23 VITALS — BP 119/76 | HR 95 | Temp 98.7°F | Ht 67.32 in | Wt 153.4 lb

## 2022-01-23 DIAGNOSIS — Z0000 Encounter for general adult medical examination without abnormal findings: Secondary | ICD-10-CM | POA: Diagnosis not present

## 2022-01-23 MED ORDER — PANTOPRAZOLE SODIUM 40 MG PO TBEC
40.0000 mg | DELAYED_RELEASE_TABLET | Freq: Every day | ORAL | 6 refills | Status: DC
Start: 2022-01-23 — End: 2023-07-23
  Filled 2022-01-23: qty 30, 30d supply, fill #0
  Filled 2022-06-02: qty 30, 30d supply, fill #1
  Filled 2022-07-22: qty 30, 30d supply, fill #2

## 2022-01-23 NOTE — Progress Notes (Signed)
Office Note 01/23/2022  CC:  Chief Complaint  Patient presents with   Annual Exam    Patient is a 21 y.o. female who is here for annual health maintenance exam and follow-up chronic headache syndrome.. I last saw her 3 mo ago. A/P as of that visit: "#1 Raynaud's phenomenon. Discussed the diagnosis with the patient, reassured. No treatment recommended at this time.  Avoidance of prolonged cold temperatures recommended. Signs/symptoms to call or return for were reviewed and pt expressed understanding.   #2 cholinergic urticaria. Discussed diagnosis.  Reassured patient. This does not cause excessive itching, resolves pretty quickly. Therefore I think any preventative treatment with antihistamine is not recommended at this time.   #3 ankles swelling.  Reassured.   No sign of venous insufficiency or volume overload. Minimize sodium intake, avoid prolonged standing.  Elevate as needed. Encouraged exercise.   #4 chronic left knee pain. She brought this up at the end of today's visit as we did not get to discuss it in any detail. We discussed possible referral to specialist for further evaluation and she was agreeable. Ordered referral to sports medicine today."  INTERIM HX: Taressa feels well. No significant problem with headaches.  She stopped taking the propranolol because she felt like this may be causing palpitations.  She is followed by a GYN for history of metromenorrhagia.  She has not had a period in quite some time and has follow-up arranged with that MD.  She is taking drospirenone.  She reports recurrent episodes of sore throat and exudate over her tonsils with intermittent tonsillar swelling. No fevers.  She does have significant frequent reflux/heartburn. At 1 point she was prescribed a PPI but she says she is not good at remembering to take medication in the morning.  Past Medical History:  Diagnosis Date   Abnormal vaginal bleeding    Dr. Aldona Bar   Acne vulgaris     Headache syndrome    summer 2022   History of community acquired pneumonia 05/2021   Menometrorrhagia    Started Yaz 01/30/19   Vasovagal syncope    summer 2022    History reviewed. No pertinent surgical history.  Family History  Problem Relation Age of Onset   Miscarriages / Stillbirths Mother    Diabetes Father    Heart attack Father    Heart disease Father    High Cholesterol Father    High blood pressure Father    Asthma Brother    Diabetes Maternal Grandmother    Heart disease Maternal Grandmother    High Cholesterol Maternal Grandmother    High blood pressure Maternal Grandmother    Miscarriages / Stillbirths Maternal Grandmother    Breast cancer Maternal Grandmother    Cancer Maternal Grandfather    COPD Maternal Grandfather    High Cholesterol Maternal Grandfather    High blood pressure Maternal Grandfather    Kidney disease Maternal Grandfather    Early death Paternal Grandfather    Heart attack Paternal Grandfather    Heart disease Paternal Grandfather     Social History   Socioeconomic History   Marital status: Single    Spouse name: Not on file   Number of children: 0   Years of education: Not on file   Highest education level: High school graduate  Occupational History    Comment: Data processing manager  Tobacco Use   Smoking status: Never   Smokeless tobacco: Never  Substance and Sexual Activity   Alcohol use: Never   Drug use: Never  Sexual activity: Never  Other Topics Concern   Not on file  Social History Narrative   Single, no children.   Lives at home with parents as of 01/2019.   Educ: HS--[employer].   Occup: Data processing manager   No T/A/Ds   Caffeine- coffee 1 c daily   Social Determinants of Health   Financial Resource Strain: Unknown (10/02/2021)   Overall Financial Resource Strain (CARDIA)    Difficulty of Paying Living Expenses: Patient refused  Food Insecurity: No Food Insecurity (10/02/2021)   Hunger Vital Sign    Worried About Running  Out of Food in the Last Year: Never true    Ran Out of Food in the Last Year: Never true  Transportation Needs: No Transportation Needs (10/02/2021)   PRAPARE - Administrator, Civil Service (Medical): No    Lack of Transportation (Non-Medical): No  Physical Activity: Inactive (10/02/2021)   Exercise Vital Sign    Days of Exercise per Week: 2 days    Minutes of Exercise per Session: 0 min  Stress: No Stress Concern Present (10/02/2021)   [employer] of Occupational Health - Occupational Stress Questionnaire    Feeling of Stress : Not at all  Social Connections: Socially Isolated (10/02/2021)   Social Connection and Isolation Panel [NHANES]    Frequency of Communication with Friends and Family: More than three times a week    Frequency of Social Gatherings with Friends and Family: More than three times a week    Attends Religious Services: Never    Database administrator or Organizations: No    Attends Engineer, structural: Not on file    Marital Status: Never married  Intimate Partner Violence: Not on file    Outpatient Medications Prior to Visit  Medication Sig Dispense Refill   Drospirenone (SLYND) 4 MG TABS TAKE 1 TABLET BY MOUTH ONCE DAILY 84 tablet 3   Drospirenone (SLYND) 4 MG TABS Take 4 mg by mouth daily.     propranolol (INDERAL) 40 MG tablet 1 tab po bid 180 tablet 3   No facility-administered medications prior to visit.    No Known Allergies  ROS Review of Systems  Constitutional:  Negative for appetite change, chills, fatigue and fever.  HENT:  Negative for congestion, dental problem, ear pain and sore throat.   Eyes:  Negative for discharge, redness and visual disturbance.  Respiratory:  Negative for cough, chest tightness, shortness of breath and wheezing.   Cardiovascular:  Negative for chest pain, palpitations and leg swelling.  Gastrointestinal:  Negative for abdominal pain, blood in stool, diarrhea, nausea and vomiting.  Genitourinary:   Negative for difficulty urinating, dysuria, flank pain, frequency, hematuria and urgency.  Musculoskeletal:  Negative for arthralgias, back pain, joint swelling, myalgias and neck stiffness.  Skin:  Negative for pallor and rash.  Neurological:  Negative for dizziness, speech difficulty, weakness and headaches.  Hematological:  Negative for adenopathy. Does not bruise/bleed easily.  Psychiatric/Behavioral:  Negative for confusion and sleep disturbance. The patient is not nervous/anxious.     PE;    01/23/2022    2:59 PM 10/06/2021   10:20 AM 08/01/2021    8:07 AM  Vitals with BMI  Height 5' 7.323" 5\' 6"  5\' 6"   Weight 153 lbs 6 oz 145 lbs 10 oz 145 lbs 10 oz  BMI 23.8 23.51 23.51  Systolic 119 128  Diastolic 76 83 76  Pulse 95 101 97  Exam chaperoned by , CMA.  Gen: Alert, well appearing.  Patient is oriented to person, place, time, and situation. AFFECT: pleasant, lucid thought and speech. ENT: Ears: EACs clear, normal epithelium.  TMs with good light reflex and landmarks bilaterally.  Eyes: no injection, icteris, swelling, or exudate.  EOMI, PERRLA. Nose: no drainage or turbinate edema/swelling.  No injection or focal lesion.  Mouth: lips without lesion/swelling.  Oral mucosa pink and moist.  Dentition intact and without obvious caries or gingival swelling.  Oropharynx without erythema, exudate, or swelling.  Neck: supple/nontender.  No LAD, mass, or TM.  Carotid pulses 2+ bilaterally, without bruits. CV: RRR, no m/r/g.   LUNGS: CTA bilat, nonlabored resps, good aeration in all lung fields. ABD: soft, NT, ND, BS normal.  No hepatospenomegaly or mass.  No bruits. EXT: no clubbing, cyanosis, or edema.  Musculoskeletal: no joint swelling, erythema, warmth, or tenderness.  ROM of all joints intact. Skin - no sores or suspicious lesions or rashes or color changes  Pertinent labs:  Lab Results  Component Value Date   TSH 2.51 05/05/2021   Lab Results  Component Value  Date   WBC 8.8 05/05/2021   HGB 14.6 05/05/2021   HCT 44.3 05/05/2021   MCV 91.9 05/05/2021   PLT 266.0 05/05/2021   Lab Results  Component Value Date   CREATININE 0.80 05/05/2021   BUN 9 05/05/2021   NA 137 05/05/2021   K 3.5 05/05/2021   CL 102 05/05/2021   CO2 24 05/05/2021   Lab Results  Component Value Date   ALT 13 05/05/2021   AST 18 05/05/2021   ALKPHOS 64 05/05/2021   BILITOT 0.5 05/05/2021    ASSESSMENT AND PLAN:   Health maintenance exam: Reviewed age and gender appropriate health maintenance issues (prudent diet, regular exercise, health risks of tobacco and excessive alcohol, use of seatbelts, fire alarms in home, use of sunscreen).  Also reviewed age and gender appropriate health screening as well as vaccine recommendations. Vaccines: HPV: pt declined but is considering.  Otherwise UTD. Labs: health panel (pt had Mt dew 3-4 hours ago). Cervical ca screening: pt has GYN MD  An After Visit Summary was printed and given to the patient.  FOLLOW UP:  No follow-ups on file.  Signed:  Santiago Bumpers, MD           01/23/2022

## 2022-01-24 LAB — CBC WITH DIFFERENTIAL/PLATELET
Absolute Monocytes: 692 cells/uL (ref 200–950)
Basophils Absolute: 23 cells/uL (ref 0–200)
Basophils Relative: 0.3 %
Eosinophils Absolute: 53 cells/uL (ref 15–500)
Eosinophils Relative: 0.7 %
HCT: 43.2 % (ref 35.0–45.0)
Hemoglobin: 15.3 g/dL (ref 11.7–15.5)
Lymphs Abs: 2189 cells/uL (ref 850–3900)
MCH: 31.4 pg (ref 27.0–33.0)
MCHC: 35.4 g/dL (ref 32.0–36.0)
MCV: 88.7 fL (ref 80.0–100.0)
MPV: 11.3 fL (ref 7.5–12.5)
Monocytes Relative: 9.1 %
Neutro Abs: 4644 cells/uL (ref 1500–7800)
Neutrophils Relative %: 61.1 %
Platelets: 290 10*3/uL (ref 140–400)
RBC: 4.87 10*6/uL (ref 3.80–5.10)
RDW: 12.8 % (ref 11.0–15.0)
Total Lymphocyte: 28.8 %
WBC: 7.6 10*3/uL (ref 3.8–10.8)

## 2022-01-24 LAB — LIPID PANEL
Cholesterol: 182 mg/dL (ref <200)
HDL: 52 mg/dL (ref >=50)
LDL Cholesterol (Calc): 108 mg/dL (calc) — ABNORMAL HIGH
Non-HDL Cholesterol (Calc): 130 mg/dL (calc) — ABNORMAL HIGH (ref <130)
Total CHOL/HDL Ratio: 3.5 (calc) (ref <5.0)
Triglycerides: 110 mg/dL (ref <150)

## 2022-01-24 LAB — COMPREHENSIVE METABOLIC PANEL
AG Ratio: 1.3 (calc) (ref 1.0–2.5)
ALT: 14 U/L (ref 6–29)
AST: 23 U/L (ref 10–30)
Albumin: 4.3 g/dL (ref 3.6–5.1)
Alkaline phosphatase (APISO): 70 U/L (ref 31–125)
BUN: 15 mg/dL (ref 7–25)
CO2: 18 mmol/L — ABNORMAL LOW (ref 20–32)
Calcium: 9.3 mg/dL (ref 8.6–10.2)
Chloride: 104 mmol/L (ref 98–110)
Creat: 0.88 mg/dL (ref 0.50–0.96)
Globulin: 3.2 g/dL (calc) (ref 1.9–3.7)
Glucose, Bld: 81 mg/dL (ref 65–99)
Potassium: 4.1 mmol/L (ref 3.5–5.3)
Sodium: 137 mmol/L (ref 135–146)
Total Bilirubin: 0.3 mg/dL (ref 0.2–1.2)
Total Protein: 7.5 g/dL (ref 6.1–8.1)

## 2022-01-24 LAB — TSH: TSH: 2.23 mIU/L

## 2022-02-02 ENCOUNTER — Ambulatory Visit: Payer: Self-pay | Admitting: Family Medicine

## 2022-03-11 ENCOUNTER — Ambulatory Visit (INDEPENDENT_AMBULATORY_CARE_PROVIDER_SITE_OTHER): Payer: No Typology Code available for payment source | Admitting: Sports Medicine

## 2022-03-11 ENCOUNTER — Ambulatory Visit (INDEPENDENT_AMBULATORY_CARE_PROVIDER_SITE_OTHER): Payer: No Typology Code available for payment source

## 2022-03-11 DIAGNOSIS — G8929 Other chronic pain: Secondary | ICD-10-CM

## 2022-03-11 DIAGNOSIS — M25562 Pain in left knee: Secondary | ICD-10-CM

## 2022-03-11 DIAGNOSIS — M2240 Chondromalacia patellae, unspecified knee: Secondary | ICD-10-CM

## 2022-03-11 NOTE — Progress Notes (Signed)
    Procedures performed today:    Procedure: Real-time Ultrasound Guided injection of the left knee Device: Samsung HS60  Verbal informed consent obtained.  Time-out conducted.  Noted no overlying erythema, induration, or other signs of local infection.  Skin prepped in a sterile fashion.  Local anesthesia: Topical Ethyl chloride.  With sterile technique and under real time ultrasound guidance: No effusion noted, 1 cc Kenalog 40, 2 cc lidocaine, 2 cc bupivacaine injected easily Completed without difficulty  Advised to call if fevers/chills, erythema, induration, drainage, or persistent bleeding.  Images permanently stored and available for review in PACS.  Impression: Technically successful ultrasound guided injection.  Procedure: Real-time Ultrasound Guided injection of the right knee Device: Samsung HS60  Verbal informed consent obtained.  Time-out conducted.  Noted no overlying erythema, induration, or other signs of local infection.  Skin prepped in a sterile fashion.  Local anesthesia: Topical Ethyl chloride.  With sterile technique and under real time ultrasound guidance: No effusion noted, 1 cc Kenalog 40, 2 cc lidocaine, 2 cc bupivacaine injected easily Completed without difficulty  Advised to call if fevers/chills, erythema, induration, drainage, or persistent bleeding.  Images permanently stored and available for review in PACS.  Impression: Technically successful ultrasound guided injection.  Independent interpretation of notes and tests performed by another provider:   None.  Brief History, Exam, Impression, and Recommendations:    Chondromalacia of patellofemoral joint This is a very pleasant 21 year old female, she works with Dr. Corliss Skains with rheumatology,, she has had over a year of knee pain, predominantly patellofemoral type symptoms, she did have some patellar crepitus on exam, at the last visit we treated her conservatively with aggressive physical  therapy. At the last visit in May she was doing really well, unfortunately now is having a recurrence of discomfort, retropatellar bilateral, worse going up and down stairs, exam is for the most part unrevealing with a bit of patellar facet tenderness bilaterally. She is requesting bilateral injections, we did bilateral steroid injections today with ultrasound guidance, due to persistence of pain for greater than 6 weeks in spite of conservative treatment we will also proceed with bilateral knee MRIs. Return in 6 weeks.    ____________________________________________ Ihor Austin. Benjamin Stain, M.D., ABFM., CAQSM., AME. Primary Care and Sports Medicine [facility]  Adjunct Professor of Family Medicine  [place] [employer] [facility] [employer]  Restaurant manager, fast food

## 2022-03-11 NOTE — Assessment & Plan Note (Signed)
This is a very pleasant 21 year old female, she works with Dr. Corliss Skains with rheumatology,, she has had over a year of knee pain, predominantly patellofemoral type symptoms, she did have some patellar crepitus on exam, at the last visit we treated her conservatively with aggressive physical therapy. At the last visit in May she was doing really well, unfortunately now is having a recurrence of discomfort, retropatellar bilateral, worse going up and down stairs, exam is for the most part unrevealing with a bit of patellar facet tenderness bilaterally. She is requesting bilateral injections, we did bilateral steroid injections today with ultrasound guidance, due to persistence of pain for greater than 6 weeks in spite of conservative treatment we will also proceed with bilateral knee MRIs. Return in 6 weeks.

## 2022-03-21 ENCOUNTER — Ambulatory Visit (INDEPENDENT_AMBULATORY_CARE_PROVIDER_SITE_OTHER): Payer: No Typology Code available for payment source

## 2022-03-21 DIAGNOSIS — M2242 Chondromalacia patellae, left knee: Secondary | ICD-10-CM | POA: Diagnosis not present

## 2022-03-21 DIAGNOSIS — M2241 Chondromalacia patellae, right knee: Secondary | ICD-10-CM

## 2022-03-21 DIAGNOSIS — M2240 Chondromalacia patellae, unspecified knee: Secondary | ICD-10-CM

## 2022-03-26 ENCOUNTER — Other Ambulatory Visit (HOSPITAL_COMMUNITY): Payer: Self-pay

## 2022-04-17 ENCOUNTER — Ambulatory Visit (INDEPENDENT_AMBULATORY_CARE_PROVIDER_SITE_OTHER): Payer: No Typology Code available for payment source | Admitting: Sports Medicine

## 2022-04-17 ENCOUNTER — Telehealth: Payer: Self-pay | Admitting: Sports Medicine

## 2022-04-17 DIAGNOSIS — M1710 Unilateral primary osteoarthritis, unspecified knee: Secondary | ICD-10-CM

## 2022-04-17 NOTE — Telephone Encounter (Signed)
[facility] approval, both knees, patient failed everything.

## 2022-04-17 NOTE — Telephone Encounter (Signed)
[employer] paperwork faxed to [facility] at [phone] Request is for [facility] Pt's insurance prefers [facility] Fax confirmation receipt received  

## 2022-04-17 NOTE — Progress Notes (Signed)
    Procedures performed today:    None.  Independent interpretation of notes and tests performed by another provider:   None.  Brief History, Exam, Impression, and Recommendations:    Patellofemoral arthritis This is a very pleasant 21 year old female, she works with Dr. Dimas Alexandria with rheumatology. She has had over a year of knee pain predominately patellofemoral, patellar crepitus on exam. We have treated her conservatively with physical therapy, NSAIDs, she has had bilateral steroid injections and MRIs. MRI did show patellofemoral arthritis. Unfortunately continues to have pain after the injection which only lasted a couple of weeks. At this point I think we should proceed with viscosupplementation, if insufficient improvement with Visco we will refer her to one of our surgical colleagues for discussion of lateral release.    ____________________________________________ Gwen Her. Dianah Field, M.D., ABFM., CAQSM., AME. Primary Care and Sports Medicine [facility]  Adjunct Professor of [place] [employer] [facility] of Medicine  Risk manager

## 2022-04-17 NOTE — Assessment & Plan Note (Signed)
This is a very pleasant 21 year old female, she works with Dr. Dimas Alexandria with rheumatology. She has had over a year of knee pain predominately patellofemoral, patellar crepitus on exam. We have treated her conservatively with physical therapy, NSAIDs, she has had bilateral steroid injections and MRIs. MRI did show patellofemoral arthritis. Unfortunately continues to have pain after the injection which only lasted a couple of weeks. At this point I think we should proceed with viscosupplementation, if insufficient improvement with Visco we will refer her to one of our surgical colleagues for discussion of lateral release.

## 2022-04-23 NOTE — Telephone Encounter (Signed)
Called [facility], they stated that als long and  the cost per injection was below $1500 then no PA was required. Patient does have a $50 copay. Patient aware of there approx cost and wishes to proceed with injections. Call transferred to the front desk for scheduling.

## 2022-04-24 ENCOUNTER — Ambulatory Visit: Payer: No Typology Code available for payment source | Admitting: Sports Medicine

## 2022-05-15 ENCOUNTER — Ambulatory Visit (INDEPENDENT_AMBULATORY_CARE_PROVIDER_SITE_OTHER): Payer: No Typology Code available for payment source

## 2022-05-15 ENCOUNTER — Ambulatory Visit (INDEPENDENT_AMBULATORY_CARE_PROVIDER_SITE_OTHER): Payer: No Typology Code available for payment source | Admitting: Sports Medicine

## 2022-05-15 DIAGNOSIS — M1710 Unilateral primary osteoarthritis, unspecified knee: Secondary | ICD-10-CM

## 2022-05-15 DIAGNOSIS — G8929 Other chronic pain: Secondary | ICD-10-CM

## 2022-05-15 DIAGNOSIS — M5412 Radiculopathy, cervical region: Secondary | ICD-10-CM | POA: Insufficient documentation

## 2022-05-15 DIAGNOSIS — M545 Low back pain, unspecified: Secondary | ICD-10-CM

## 2022-05-15 DIAGNOSIS — M7542 Impingement syndrome of left shoulder: Secondary | ICD-10-CM | POA: Diagnosis not present

## 2022-05-15 DIAGNOSIS — M25519 Pain in unspecified shoulder: Secondary | ICD-10-CM | POA: Insufficient documentation

## 2022-05-15 DIAGNOSIS — M25512 Pain in left shoulder: Secondary | ICD-10-CM | POA: Diagnosis not present

## 2022-05-15 NOTE — Assessment & Plan Note (Signed)
Also having chronic axial low back pain, midline without radiculopathy, okay during the day but worse at night when she lays flat. Adding x-rays, home conditioning, return to see me in 6 weeks, MRI if no better.

## 2022-05-15 NOTE — Assessment & Plan Note (Signed)
Also having some increasing pain in the left shoulder, worse when sitting still but not so much with moving. She just got a flu shot in her left shoulder today so I will defer an aggressive exam because most movements are painful right now. Adding x-rays, rotator cuff conditioning, return to see me in 6 weeks, MRI if no better.

## 2022-05-15 NOTE — Progress Notes (Signed)
    Procedures performed today:    Procedure: Real-time Ultrasound Guided injection of the left knee Device: Samsung HS60  Verbal informed consent obtained.  Time-out conducted.  Noted no overlying erythema, induration, or other signs of local infection.  Skin prepped in a sterile fashion.  Local anesthesia: Topical Ethyl chloride.  With sterile technique and under real time ultrasound guidance: Trace effusion noted, 30 mg/2 mL of OrthoVisc (sodium hyaluronate) in a prefilled syringe was injected easily into the knee through a 22-gauge needle. Completed without difficulty  Advised to call if fevers/chills, erythema, induration, drainage, or persistent bleeding.  Images permanently stored and available for review in PACS.  Impression: Technically successful ultrasound guided injection.  Procedure: Real-time Ultrasound Guided injection of the right knee Device: Samsung HS60  Verbal informed consent obtained.  Time-out conducted.  Noted no overlying erythema, induration, or other signs of local infection.  Skin prepped in a sterile fashion.  Local anesthesia: Topical Ethyl chloride.  With sterile technique and under real time ultrasound guidance: Trace effusion noted, 30 mg/2 mL of OrthoVisc (sodium hyaluronate) in a prefilled syringe was injected easily into the knee through a 22-gauge needle. Completed without difficulty  Advised to call if fevers/chills, erythema, induration, drainage, or persistent bleeding.  Images permanently stored and available for review in PACS.  Impression: Technically successful ultrasound guided injection.  Independent interpretation of notes and tests performed by another provider:   None.  Brief History, Exam, Impression, and Recommendations:    Patellofemoral arthritis Pleasant 21 year old female, works with as an Environmental consultant with rheumatology. She had over a year of knee pain, bilateral, patellofemoral with patellar crepitus on exam. We treated her  conservatively with formal PT, NSAIDs, bilateral steroid injections, nothing helped. Ultimately the MRI showed patellofemoral osteoarthritis. We are proceeding with viscosupplementation today, return to see me in 1 week for Orthovisc 2 of 4 both knees.  Impingement syndrome, shoulder, left Also having some increasing pain in the left shoulder, worse when sitting still but not so much with moving. She just got a flu shot in her left shoulder today so I will defer an aggressive exam because most movements are painful right now. Adding x-rays, rotator cuff conditioning, return to see me in 6 weeks, MRI if no better.  Chronic low back pain Also having chronic axial low back pain, midline without radiculopathy, okay during the day but worse at night when she lays flat. Adding x-rays, home conditioning, return to see me in 6 weeks, MRI if no better.    ____________________________________________ Gwen Her. Dianah Field, M.D., ABFM., CAQSM., AME. Primary Care and Sports Medicine  [facility]  Adjunct Professor of [place] of [facility] of Medicine  Risk manager

## 2022-05-15 NOTE — Assessment & Plan Note (Signed)
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 She had over a year of knee pain, bilateral, patellofemoral with patellar crepitus on exam. We treated her conservatively with formal PT, NSAIDs, bilateral steroid injections, nothing helped. Ultimately the MRI showed patellofemoral osteoarthritis. We are proceeding with viscosupplementation today, return to see me in 1 week for Orthovisc 2 of 4 both knees.

## 2022-05-22 ENCOUNTER — Ambulatory Visit (INDEPENDENT_AMBULATORY_CARE_PROVIDER_SITE_OTHER): Payer: No Typology Code available for payment source

## 2022-05-22 ENCOUNTER — Ambulatory Visit (INDEPENDENT_AMBULATORY_CARE_PROVIDER_SITE_OTHER): Payer: No Typology Code available for payment source | Admitting: Sports Medicine

## 2022-05-22 DIAGNOSIS — M1710 Unilateral primary osteoarthritis, unspecified knee: Secondary | ICD-10-CM

## 2022-05-22 NOTE — Assessment & Plan Note (Signed)
Orthovisc 2 of 4 both knees, return in 1 week for #3 of 4. 

## 2022-05-22 NOTE — Progress Notes (Signed)
    Procedures performed today:    Procedure: Real-time Ultrasound Guided injection of the left knee Device: Samsung HS60  Verbal informed consent obtained.  Time-out conducted.  Noted no overlying erythema, induration, or other signs of local infection.  Skin prepped in a sterile fashion.  Local anesthesia: Topical Ethyl chloride.  With sterile technique and under real time ultrasound guidance: Trace effusion noted, 30 mg/2 mL of OrthoVisc (sodium hyaluronate) in a prefilled syringe was injected easily into the knee through a 22-gauge needle. Completed without difficulty  Advised to call if fevers/chills, erythema, induration, drainage, or persistent bleeding.  Images permanently stored and available for review in PACS.  Impression: Technically successful ultrasound guided injection.   Procedure: Real-time Ultrasound Guided injection of the right knee Device: Samsung HS60  Verbal informed consent obtained.  Time-out conducted.  Noted no overlying erythema, induration, or other signs of local infection.  Skin prepped in a sterile fashion.  Local anesthesia: Topical Ethyl chloride.  With sterile technique and under real time ultrasound guidance: Trace effusion noted, 30 mg/2 mL of OrthoVisc (sodium hyaluronate) in a prefilled syringe was injected easily into the knee through a 22-gauge needle. Completed without difficulty  Advised to call if fevers/chills, erythema, induration, drainage, or persistent bleeding.  Images permanently stored and available for review in PACS.  Impression: Technically successful ultrasound guided injection.  Independent interpretation of notes and tests performed by another provider:   None.  Brief History, Exam, Impression, and Recommendations:    Patellofemoral arthritis Orthovisc 2 of 4 both knees, return in 1 week for #3 of 4.    ____________________________________________ Gwen Her. Dianah Field, M.D., ABFM., CAQSM., AME. Primary Care and  Sports Medicine [place] [facility]  Adjunct Professor of [place] of [facility] of Medicine  Risk manager

## 2022-05-29 ENCOUNTER — Ambulatory Visit (INDEPENDENT_AMBULATORY_CARE_PROVIDER_SITE_OTHER): Payer: No Typology Code available for payment source

## 2022-05-29 ENCOUNTER — Ambulatory Visit: Payer: No Typology Code available for payment source | Admitting: Sports Medicine

## 2022-05-29 DIAGNOSIS — M1710 Unilateral primary osteoarthritis, unspecified knee: Secondary | ICD-10-CM | POA: Diagnosis not present

## 2022-05-29 MED ORDER — HYALURONAN 30 MG/2ML IX SOSY
30.0000 mg | PREFILLED_SYRINGE | Freq: Once | INTRA_ARTICULAR | Status: AC
  Administered 2022-05-29: 30 mg via INTRA_ARTICULAR

## 2022-05-29 NOTE — Addendum Note (Signed)
Addended by: Tarri Glenn A on: 05/29/2022 04:35 PM   Modules accepted: Orders

## 2022-05-29 NOTE — Progress Notes (Signed)
    Procedures performed today:    Procedure: Real-time Ultrasound Guided injection of the left knee Device: Samsung HS60  Verbal informed consent obtained.  Time-out conducted.  Noted no overlying erythema, induration, or other signs of local infection.  Skin prepped in a sterile fashion.  Local anesthesia: Topical Ethyl chloride.  With sterile technique and under real time ultrasound guidance: Trace effusion noted, 30 mg/2 mL of OrthoVisc (sodium hyaluronate) in a prefilled syringe was injected easily into the knee through a 22-gauge needle. Completed without difficulty  Advised to call if fevers/chills, erythema, induration, drainage, or persistent bleeding.  Images permanently stored and available for review in PACS.  Impression: Technically successful ultrasound guided injection.   Procedure: Real-time Ultrasound Guided injection of the right knee Device: Samsung HS60  Verbal informed consent obtained.  Time-out conducted.  Noted no overlying erythema, induration, or other signs of local infection.  Skin prepped in a sterile fashion.  Local anesthesia: Topical Ethyl chloride.  With sterile technique and under real time ultrasound guidance: Trace effusion noted, 30 mg/2 mL of OrthoVisc (sodium hyaluronate) in a prefilled syringe was injected easily into the knee through a 22-gauge needle. Completed without difficulty  Advised to call if fevers/chills, erythema, induration, drainage, or persistent bleeding.  Images permanently stored and available for review in PACS.  Impression: Technically successful ultrasound guided injection.  Independent interpretation of notes and tests performed by another provider:   None.  Brief History, Exam, Impression, and Recommendations:    Patellofemoral arthritis Orthovisc 3 of 4 both knees, return in 1 week for #4 of 4 both knees.    ____________________________________________ Gwen Her. Dianah Field, M.D., ABFM., CAQSM., AME. Primary  Care and Sports Medicine [place] [facility]  Adjunct Professor of [place] of [facility] of Medicine  Risk manager

## 2022-05-29 NOTE — Assessment & Plan Note (Signed)
Orthovisc 3 of 4 both knees, return in 1 week for #4 of 4 both knees.

## 2022-06-02 ENCOUNTER — Other Ambulatory Visit (HOSPITAL_COMMUNITY): Payer: Self-pay

## 2022-06-05 ENCOUNTER — Ambulatory Visit (INDEPENDENT_AMBULATORY_CARE_PROVIDER_SITE_OTHER): Payer: No Typology Code available for payment source | Admitting: Sports Medicine

## 2022-06-05 ENCOUNTER — Ambulatory Visit (INDEPENDENT_AMBULATORY_CARE_PROVIDER_SITE_OTHER): Payer: No Typology Code available for payment source

## 2022-06-05 DIAGNOSIS — M1712 Unilateral primary osteoarthritis, left knee: Secondary | ICD-10-CM

## 2022-06-05 DIAGNOSIS — M1710 Unilateral primary osteoarthritis, unspecified knee: Secondary | ICD-10-CM

## 2022-06-05 MED ORDER — HYALURONAN 30 MG/2ML IX SOSY
30.0000 mg | PREFILLED_SYRINGE | Freq: Once | INTRA_ARTICULAR | Status: AC
  Administered 2022-06-05: 30 mg via INTRA_ARTICULAR

## 2022-06-05 NOTE — Assessment & Plan Note (Signed)
Orthovisc 4 of 4 both knees, doing well. Return to see me in 6 weeks.

## 2022-06-05 NOTE — Progress Notes (Signed)
    Procedures performed today:    Procedure: Real-time Ultrasound Guided injection of the left knee Device: Samsung HS60  Verbal informed consent obtained.  Time-out conducted.  Noted no overlying erythema, induration, or other signs of local infection.  Skin prepped in a sterile fashion.  Local anesthesia: Topical Ethyl chloride.  With sterile technique and under real time ultrasound guidance: Trace effusion noted, 30 mg/2 mL of OrthoVisc (sodium hyaluronate) in a prefilled syringe was injected easily into the knee through a 22-gauge needle. Completed without difficulty  Advised to call if fevers/chills, erythema, induration, drainage, or persistent bleeding.  Images permanently stored and available for review in PACS.  Impression: Technically successful ultrasound guided injection.   Procedure: Real-time Ultrasound Guided injection of the right knee Device: Samsung HS60  Verbal informed consent obtained.  Time-out conducted.  Noted no overlying erythema, induration, or other signs of local infection.  Skin prepped in a sterile fashion.  Local anesthesia: Topical Ethyl chloride.  With sterile technique and under real time ultrasound guidance: Trace effusion noted, 30 mg/2 mL of OrthoVisc (sodium hyaluronate) in a prefilled syringe was injected easily into the knee through a 22-gauge needle. Completed without difficulty  Advised to call if fevers/chills, erythema, induration, drainage, or persistent bleeding.  Images permanently stored and available for review in PACS.  Impression: Technically successful ultrasound guided injection.  Independent interpretation of notes and tests performed by another provider:   None.  Brief History, Exam, Impression, and Recommendations:    Patellofemoral arthritis Orthovisc 4 of 4 both knees, doing well. Return to see me in 6 weeks.    ____________________________________________ Heidi Mccormick. Benjamin Stain, M.D., ABFM., CAQSM., AME. Primary  Care and Sports Medicine [place] [facility]  Adjunct Professor of Family Medicine  [employer] of [facility] of Medicine  Restaurant manager, fast food

## 2022-06-05 NOTE — Addendum Note (Signed)
Addended by: Carren Rang A on: 06/05/2022 03:56 PM   Modules accepted: Orders

## 2022-06-09 ENCOUNTER — Other Ambulatory Visit (HOSPITAL_COMMUNITY): Payer: Self-pay

## 2022-06-11 ENCOUNTER — Ambulatory Visit: Payer: No Typology Code available for payment source | Admitting: Sports Medicine

## 2022-07-17 ENCOUNTER — Other Ambulatory Visit (HOSPITAL_COMMUNITY): Payer: Self-pay

## 2022-07-17 ENCOUNTER — Ambulatory Visit (INDEPENDENT_AMBULATORY_CARE_PROVIDER_SITE_OTHER): Payer: No Typology Code available for payment source | Admitting: Sports Medicine

## 2022-07-17 ENCOUNTER — Ambulatory Visit (INDEPENDENT_AMBULATORY_CARE_PROVIDER_SITE_OTHER): Payer: No Typology Code available for payment source

## 2022-07-17 DIAGNOSIS — M5412 Radiculopathy, cervical region: Secondary | ICD-10-CM

## 2022-07-17 DIAGNOSIS — M2550 Pain in unspecified joint: Secondary | ICD-10-CM | POA: Diagnosis not present

## 2022-07-17 DIAGNOSIS — M25519 Pain in unspecified shoulder: Secondary | ICD-10-CM

## 2022-07-17 DIAGNOSIS — M542 Cervicalgia: Secondary | ICD-10-CM

## 2022-07-17 DIAGNOSIS — M1710 Unilateral primary osteoarthritis, unspecified knee: Secondary | ICD-10-CM | POA: Diagnosis not present

## 2022-07-17 MED ORDER — GABAPENTIN 300 MG PO CAPS
300.0000 mg | ORAL_CAPSULE | Freq: Every day | ORAL | 3 refills | Status: DC
  Filled 2022-07-17: qty 30, 30d supply, fill #0
  Filled 2022-08-19: qty 30, 30d supply, fill #1

## 2022-07-17 NOTE — Progress Notes (Addendum)
    Procedures performed today:    None.  Independent interpretation of notes and tests performed by another provider:   None.  Brief History, Exam, Impression, and Recommendations:    Polyarthralgia Multiple joint aches and pains, mother with rheumatic type symptoms, I am going to test her for rheumatoid arthritis.  Neck and shoulder pain, left Had some increasing pain left shoulder, shoulder exam was normal today, I would like cervical spine x-rays and cervical spine MRI considering persistence of treatment in spite of greater than 6 weeks of physician directed home therapy and medications. Adding Neurontin nightly.  Update:  Cervical spine MRI is completely unremarkable, this suggests that the neck pain is likely more muscular or myofascial, Neurontin should be quite helpful, we can certainly try other neuropathic agents such as Lyrica if we do not get efficacy from Neurontin, SNRIs like duloxetine can also be an option in the future.  Patellofemoral arthritis Pleasant 21 year old female, patellofemoral arthritis on MRIs, we have finish steroid injections, physical therapy, a series of Orthovisc without sufficient improvement, I think we need a surgical consultation.    ____________________________________________ Ihor Austin. Benjamin Stain, M.D., ABFM., CAQSM., AME. Primary Care and Sports Medicine [place] [facility]  Adjunct Professor of Family Medicine  [place] of [facility] of Medicine  Restaurant manager, fast food

## 2022-07-17 NOTE — Assessment & Plan Note (Signed)
Pleasant 21 year old female, patellofemoral arthritis on MRIs, we have finish steroid injections, physical therapy, a series of Orthovisc without sufficient improvement, I think we need a surgical consultation.

## 2022-07-17 NOTE — Assessment & Plan Note (Addendum)
Had some increasing pain left shoulder, shoulder exam was normal today, I would like cervical spine x-rays and cervical spine MRI considering persistence of treatment in spite of greater than 6 weeks of physician directed home therapy and medications. Adding Neurontin nightly.  Update:  Cervical spine MRI is completely unremarkable, this suggests that the neck pain is likely more muscular or myofascial, Neurontin should be quite helpful, we can certainly try other neuropathic agents such as Lyrica if we do not get efficacy from Neurontin, SNRIs like duloxetine can also be an option in the future.

## 2022-07-17 NOTE — Assessment & Plan Note (Signed)
Multiple joint aches and pains, mother with rheumatic type symptoms, I am going to test her for rheumatoid arthritis.

## 2022-07-21 ENCOUNTER — Encounter: Payer: Self-pay | Admitting: Family Medicine

## 2022-07-21 LAB — COMPREHENSIVE METABOLIC PANEL
AG Ratio: 1.5 (calc) (ref 1.0–2.5)
ALT: 24 U/L (ref 6–29)
AST: 23 U/L (ref 10–30)
Albumin: 4.5 g/dL (ref 3.6–5.1)
Alkaline phosphatase (APISO): 71 U/L (ref 31–125)
BUN: 9 mg/dL (ref 7–25)
CO2: 25 mmol/L (ref 20–32)
Calcium: 9.8 mg/dL (ref 8.6–10.2)
Chloride: 106 mmol/L (ref 98–110)
Creat: 0.85 mg/dL (ref 0.50–0.96)
Globulin: 3 g/dL (calc) (ref 1.9–3.7)
Glucose, Bld: 105 mg/dL — ABNORMAL HIGH (ref 65–99)
Potassium: 3.9 mmol/L (ref 3.5–5.3)
Sodium: 140 mmol/L (ref 135–146)
Total Bilirubin: 0.4 mg/dL (ref 0.2–1.2)
Total Protein: 7.5 g/dL (ref 6.1–8.1)

## 2022-07-21 LAB — CBC WITH DIFFERENTIAL/PLATELET
Absolute Monocytes: 575 cells/uL (ref 200–950)
Basophils Absolute: 28 cells/uL (ref 0–200)
Basophils Relative: 0.4 %
Eosinophils Absolute: 50 cells/uL (ref 15–500)
Eosinophils Relative: 0.7 %
HCT: 41.9 % (ref 35.0–45.0)
Hemoglobin: 14.6 g/dL (ref 11.7–15.5)
Lymphs Abs: 1938 cells/uL (ref 850–3900)
MCH: 30.5 pg (ref 27.0–33.0)
MCHC: 34.8 g/dL (ref 32.0–36.0)
MCV: 87.5 fL (ref 80.0–100.0)
MPV: 10.9 fL (ref 7.5–12.5)
Monocytes Relative: 8.1 %
Neutro Abs: 4509 cells/uL (ref 1500–7800)
Neutrophils Relative %: 63.5 %
Platelets: 297 10*3/uL (ref 140–400)
RBC: 4.79 10*6/uL (ref 3.80–5.10)
RDW: 12.4 % (ref 11.0–15.0)
Total Lymphocyte: 27.3 %
WBC: 7.1 10*3/uL (ref 3.8–10.8)

## 2022-07-21 LAB — CK: Total CK: 86 U/L (ref 29–143)

## 2022-07-21 LAB — RHEUMATOID ARTHRITIS DIAGNOSTIC PANEL, COMPREHENSIVE
Cyclic Citrullin Peptide Ab: <16 Units (ref <20)
Rheumatoid Factor (IgA): <5 U (ref <=6)
Rheumatoid Factor (IgG): 6 U (ref <=6)
Rheumatoid Factor (IgM): 12 U — ABNORMAL HIGH (ref <=6)
SSA (Ro) (ENA) Antibody, IgG: <1 AI
SSB (La) (ENA) Antibody, IgG: <1 AI

## 2022-07-21 LAB — URIC ACID: Uric Acid, Serum: 4.8 mg/dL (ref 2.5–7.0)

## 2022-07-21 LAB — SEDIMENTATION RATE: Sed Rate: 2 mm/h (ref 0–20)

## 2022-07-22 ENCOUNTER — Encounter: Payer: Self-pay | Admitting: Sports Medicine

## 2022-07-24 ENCOUNTER — Ambulatory Visit (HOSPITAL_BASED_OUTPATIENT_CLINIC_OR_DEPARTMENT_OTHER)
Admission: RE | Admit: 2022-07-24 | Discharge: 2022-07-24 | Disposition: A | Discharge status: Home / Self Care | Payer: No Typology Code available for payment source | Source: Ambulatory Visit | Attending: Sports Medicine | Admitting: Sports Medicine

## 2022-07-24 DIAGNOSIS — M542 Cervicalgia: Secondary | ICD-10-CM

## 2022-07-24 DIAGNOSIS — M5412 Radiculopathy, cervical region: Secondary | ICD-10-CM | POA: Diagnosis present

## 2022-07-31 ENCOUNTER — Other Ambulatory Visit: Payer: Self-pay

## 2022-07-31 ENCOUNTER — Ambulatory Visit (INDEPENDENT_AMBULATORY_CARE_PROVIDER_SITE_OTHER): Payer: 59 | Admitting: Orthopaedic Surgery

## 2022-07-31 ENCOUNTER — Encounter (HOSPITAL_BASED_OUTPATIENT_CLINIC_OR_DEPARTMENT_OTHER): Payer: Self-pay | Admitting: Orthopaedic Surgery

## 2022-07-31 DIAGNOSIS — M25561 Pain in right knee: Secondary | ICD-10-CM

## 2022-07-31 DIAGNOSIS — M25562 Pain in left knee: Secondary | ICD-10-CM | POA: Diagnosis not present

## 2022-07-31 DIAGNOSIS — G8929 Other chronic pain: Secondary | ICD-10-CM | POA: Diagnosis not present

## 2022-07-31 NOTE — Progress Notes (Signed)
Office Visit Note   Patient: Heidi Mccormick           Date of Birth: 07-29-2000           MRN: [id number] Visit Date: 07/31/2022              Requested by: Silverio Decamp, [address],  [place] [address] PCP: Tammi Sou, MD   Assessment & Plan: Visit Diagnoses:  1. Chronic pain of both knees     Plan: Impression is bilateral knee patellofemoral syndrome with arthritic changes patella femoral compartment.  At this point, the patient has tried oral NSAIDs, cortisone injection, viscosupplementation injection as well as many months of physical therapy all without relief.  We have discussed proceeding with a left knee arthroscopic lateral release and microfracture of the patella at this point.  She would like to proceed.  Risk, benefits and possible complications reviewed.  Rehab recovery time discussed.  Follow-Up Instructions: Return for post-op.   Orders:  No orders of the defined types were placed in this encounter.  No orders of the defined types were placed in this encounter.     Procedures: No procedures performed   Clinical Data: No additional findings.   Subjective: Chief Complaint  Patient presents with   Right Knee - Pain   Left Knee - Pain    HPI patient is a very pleasant 22 year old female who comes in today with chronic bilateral knee pain left greater than right.  She notes that her left knee started bothering her years ago and the right knee started about a year ago after being in physical therapy for the left knee.  The pain she has primarily to the anterior aspect but does have occasional lateral pain.  Symptoms are worse with stair climbing and squatting.  She has been taking ibuprofen and meloxicam.  She has been seen by Dr. Dianah Field where she has been to physical therapy.  This did somewhat help with stability.  She also received kinesiotaping which helped.  She additionally has undergone cortisone injections to  both knees which caused significant swelling but did help for about a week.  Viscosupplementation injections were also performed to both knees which may have slightly helped with her pain at night.  She has not tried PSO bracing.  MRI of the left knee performed in August 2023 shows slight degenerative changes to the medial meniscus without tear, mild patellofemoral arthritic changes with focal full-thickness defect of the patellar apex.  Right knee MRI from the same time shows mild patellofemoral arthritic change as well as a small benign osteochondroma medial tibia.  Review of Systems as detailed in HPI.  All others reviewed and are negative.   Objective: Vital Signs: There were no vitals taken for this visit.  Physical Exam well-developed well-nourished female no acute distress.  Alert and oriented x 3.  Ortho Exam left knee exam shows range of motion 0 to 125 degrees.  Slight lateral joint line tenderness.  Moderate patellofemoral crepitus.  He does have a slightly increased Q angle.  No patellar apprehension.  Ligaments are stable.  Right knee exam shows range of motion 0 125 degrees.  No joint line tenderness.  No patellofemoral crepitus.  Specialty Comments:  No specialty comments available.  Imaging: No new imaging   PMFS History: Patient Active Problem List   Diagnosis Date Noted   Polyarthralgia 07/17/2022   Neck and shoulder pain, left 05/15/2022   Chronic low  back pain 05/15/2022   Patellofemoral arthritis 11/10/2021   Migraine 05/29/2021   Past Medical History:  Diagnosis Date   Abnormal vaginal bleeding    Dr. Stann Mainland   Acne vulgaris    Headache syndrome    summer 2022   History of community acquired pneumonia 05/2021   Menometrorrhagia    Started Yaz 01/30/19   Patellofemoral arthritis    bilat 2023   Vasovagal syncope    summer 2022    Family History  Problem Relation Age of Onset   Miscarriages / [place] Mother    Diabetes Father    Heart attack Father     Heart disease Father    High Cholesterol Father    High blood pressure Father    Asthma Brother    Diabetes Maternal Grandmother    Heart disease Maternal Grandmother    High Cholesterol Maternal Grandmother    High blood pressure Maternal Grandmother    Miscarriages / Stillbirths Maternal Grandmother    Breast cancer Maternal Grandmother    Cancer Maternal Grandfather    COPD Maternal Grandfather    High Cholesterol Maternal Grandfather    High blood pressure Maternal Grandfather    Kidney disease Maternal Grandfather    Early death Paternal Grandfather    Heart attack Paternal Grandfather    Heart disease Paternal Grandfather     No past surgical history on file. Social History   Occupational History    Comment: Warehouse manager  Tobacco Use   Smoking status: Never   Smokeless tobacco: Never  Substance and Sexual Activity   Alcohol use: Never   Drug use: Never   Sexual activity: Never

## 2022-08-03 NOTE — Progress Notes (Signed)

## 2022-08-05 ENCOUNTER — Ambulatory Visit (HOSPITAL_BASED_OUTPATIENT_CLINIC_OR_DEPARTMENT_OTHER): Payer: 59 | Admitting: Anesthesiology

## 2022-08-05 ENCOUNTER — Encounter: Payer: Self-pay | Admitting: Orthopaedic Surgery

## 2022-08-05 ENCOUNTER — Encounter (HOSPITAL_BASED_OUTPATIENT_CLINIC_OR_DEPARTMENT_OTHER)
Admission: RE | Disposition: A | Discharge status: Home / Self Care | Payer: Self-pay | Source: Home / Self Care | Attending: Orthopaedic Surgery

## 2022-08-05 ENCOUNTER — Other Ambulatory Visit (HOSPITAL_COMMUNITY): Payer: Self-pay

## 2022-08-05 ENCOUNTER — Ambulatory Visit (HOSPITAL_BASED_OUTPATIENT_CLINIC_OR_DEPARTMENT_OTHER)
Admission: RE | Admit: 2022-08-05 | Discharge: 2022-08-05 | Disposition: A | Discharge status: Home / Self Care | Payer: 59 | Attending: Orthopaedic Surgery | Admitting: Orthopaedic Surgery

## 2022-08-05 ENCOUNTER — Encounter (HOSPITAL_BASED_OUTPATIENT_CLINIC_OR_DEPARTMENT_OTHER): Payer: Self-pay | Admitting: Orthopaedic Surgery

## 2022-08-05 ENCOUNTER — Other Ambulatory Visit: Payer: Self-pay

## 2022-08-05 DIAGNOSIS — M1710 Unilateral primary osteoarthritis, unspecified knee: Secondary | ICD-10-CM

## 2022-08-05 DIAGNOSIS — Z01818 Encounter for other preprocedural examination: Secondary | ICD-10-CM

## 2022-08-05 DIAGNOSIS — K219 Gastro-esophageal reflux disease without esophagitis: Secondary | ICD-10-CM | POA: Insufficient documentation

## 2022-08-05 DIAGNOSIS — M1712 Unilateral primary osteoarthritis, left knee: Secondary | ICD-10-CM

## 2022-08-05 DIAGNOSIS — M25862 Other specified joint disorders, left knee: Secondary | ICD-10-CM | POA: Diagnosis not present

## 2022-08-05 HISTORY — DX: Gastro-esophageal reflux disease without esophagitis: K21.9

## 2022-08-05 HISTORY — PX: KNEE ARTHROSCOPY WITH LATERAL RELEASE: SHX5649

## 2022-08-05 LAB — POCT PREGNANCY, URINE: Preg Test, Ur: NEGATIVE

## 2022-08-05 SURGERY — ARTHROSCOPY, KNEE, WITH LATERAL RETINACULUM RELEASE
Anesthesia: General | Site: Knee | Laterality: Left

## 2022-08-05 MED ORDER — CEFAZOLIN SODIUM-DEXTROSE 2-4 GM/100ML-% IV SOLN
2.0000 g | INTRAVENOUS | Status: AC
  Administered 2022-08-05: 2 g via INTRAVENOUS

## 2022-08-05 MED ORDER — CEFAZOLIN SODIUM-DEXTROSE 2-4 GM/100ML-% IV SOLN
INTRAVENOUS | Status: AC
  Filled 2022-08-05: qty 100

## 2022-08-05 MED ORDER — ONDANSETRON HCL 4 MG/2ML IJ SOLN: 4.0000 mg | Freq: Four times a day (QID) | INTRAMUSCULAR | Status: DC | PRN

## 2022-08-05 MED ORDER — OXYCODONE-ACETAMINOPHEN 5-325 MG PO TABS
1.0000 | ORAL_TABLET | Freq: Two times a day (BID) | ORAL | 0 refills | Status: DC | PRN
  Filled 2022-08-05: qty 20, 5d supply, fill #0

## 2022-08-05 MED ORDER — MIDAZOLAM HCL 2 MG/2ML IJ SOLN
INTRAMUSCULAR | Status: AC
  Filled 2022-08-05: qty 2

## 2022-08-05 MED ORDER — EPINEPHRINE PF 1 MG/ML IJ SOLN
INTRAMUSCULAR | Status: AC
  Filled 2022-08-05: qty 2

## 2022-08-05 MED ORDER — IBUPROFEN 800 MG PO TABS
800.0000 mg | ORAL_TABLET | Freq: Three times a day (TID) | ORAL | 2 refills | Status: DC | PRN
  Filled 2022-08-05: qty 30, 10d supply, fill #0

## 2022-08-05 MED ORDER — OXYCODONE HCL 5 MG/5ML PO SOLN: 5.0000 mg | Freq: Once | ORAL | Status: DC | PRN

## 2022-08-05 MED ORDER — FENTANYL CITRATE (PF) 100 MCG/2ML IJ SOLN
INTRAMUSCULAR | Status: DC | PRN
  Administered 2022-08-05: 50 ug via INTRAVENOUS
  Administered 2022-08-05 (×2): 25 ug via INTRAVENOUS
  Administered 2022-08-05: 50 ug via INTRAVENOUS

## 2022-08-05 MED ORDER — MIDAZOLAM HCL 5 MG/5ML IJ SOLN
INTRAMUSCULAR | Status: DC | PRN
  Administered 2022-08-05: 2 mg via INTRAVENOUS

## 2022-08-05 MED ORDER — LACTATED RINGERS IV SOLN: INTRAVENOUS | Status: DC

## 2022-08-05 MED ORDER — SODIUM CHLORIDE 0.9 % IR SOLN
Status: DC | PRN
  Administered 2022-08-05: 3000 mL

## 2022-08-05 MED ORDER — FENTANYL CITRATE (PF) 100 MCG/2ML IJ SOLN
25.0000 ug | INTRAMUSCULAR | Status: DC | PRN
  Administered 2022-08-05: 50 ug via INTRAVENOUS

## 2022-08-05 MED ORDER — BUPIVACAINE HCL (PF) 0.25 % IJ SOLN
INTRAMUSCULAR | Status: AC
  Filled 2022-08-05: qty 30

## 2022-08-05 MED ORDER — OXYCODONE HCL 5 MG PO TABS: 5.0000 mg | ORAL_TABLET | Freq: Once | ORAL | Status: DC | PRN

## 2022-08-05 MED ORDER — ONDANSETRON HCL 4 MG PO TABS
4.0000 mg | ORAL_TABLET | Freq: Three times a day (TID) | ORAL | 0 refills | Status: DC | PRN
  Filled 2022-08-05: qty 20, 4d supply, fill #0

## 2022-08-05 MED ORDER — FENTANYL CITRATE (PF) 100 MCG/2ML IJ SOLN
INTRAMUSCULAR | Status: AC
  Filled 2022-08-05: qty 2

## 2022-08-05 MED ORDER — BUPIVACAINE-EPINEPHRINE (PF) 0.5% -1:200000 IJ SOLN
INTRAMUSCULAR | Status: AC
  Filled 2022-08-05: qty 30

## 2022-08-05 MED ORDER — PROPOFOL 10 MG/ML IV BOLUS
INTRAVENOUS | Status: DC | PRN
  Administered 2022-08-05: 150 mg via INTRAVENOUS

## 2022-08-05 MED ORDER — BUPIVACAINE HCL (PF) 0.25 % IJ SOLN
INTRAMUSCULAR | Status: DC | PRN
  Administered 2022-08-05: 20 mL

## 2022-08-05 MED ORDER — LIDOCAINE 2% (20 MG/ML) 5 ML SYRINGE
INTRAMUSCULAR | Status: DC | PRN
  Administered 2022-08-05: 60 mg via INTRAVENOUS

## 2022-08-05 SURGICAL SUPPLY — 39 items
BANDAGE ESMARK 6X9 LF (GAUZE/BANDAGES/DRESSINGS) IMPLANT
BLADE SHAVER TORPEDO 4X13 (MISCELLANEOUS) ×2 IMPLANT
BNDG CMPR 9X6 STRL LF SNTH (GAUZE/BANDAGES/DRESSINGS)
BNDG ELASTIC 6X5.8 VLCR STR LF (GAUZE/BANDAGES/DRESSINGS) ×4 IMPLANT
BNDG ESMARK 6X9 LF (GAUZE/BANDAGES/DRESSINGS)
BURR OVAL 8 FLU 4.0X13 (MISCELLANEOUS) IMPLANT
COOLER ICEMAN CLASSIC (MISCELLANEOUS) ×2 IMPLANT
CUFF TOURN SGL QUICK 34 (TOURNIQUET CUFF) ×1
CUFF TRNQT CYL 34X4.125X (TOURNIQUET CUFF) ×2 IMPLANT
DRAPE ARTHROSCOPY W/POUCH 90 (DRAPES) ×2 IMPLANT
DRAPE IMP U-DRAPE 54X76 (DRAPES) ×2 IMPLANT
DRAPE U-SHAPE 47X51 STRL (DRAPES) ×2 IMPLANT
DURAPREP 26ML APPLICATOR (WOUND CARE) ×2 IMPLANT
ELECT MENISCUS 165MM 90D (ELECTRODE) IMPLANT
ELECT REM PT RETURN 9FT ADLT (ELECTROSURGICAL) ×1
ELECTRODE REM PT RTRN 9FT ADLT (ELECTROSURGICAL) ×2 IMPLANT
GAUZE SPONGE 4X4 12PLY STRL (GAUZE/BANDAGES/DRESSINGS) ×2 IMPLANT
GAUZE XEROFORM 1X8 LF (GAUZE/BANDAGES/DRESSINGS) ×2 IMPLANT
GLOVE ECLIPSE 7.0 STRL STRAW (GLOVE) ×2 IMPLANT
GLOVE INDICATOR 7.0 STRL GRN (GLOVE) ×2 IMPLANT
GLOVE INDICATOR 7.5 STRL GRN (GLOVE) ×2 IMPLANT
GLOVE SURG SYN 7.5  E (GLOVE) ×1
GLOVE SURG SYN 7.5 E (GLOVE) ×1 IMPLANT
GLOVE SURG SYN 7.5 PF PI (GLOVE) ×2 IMPLANT
GOWN STRL REUS W/ TWL LRG LVL3 (GOWN DISPOSABLE) ×2 IMPLANT
GOWN STRL REUS W/ TWL XL LVL3 (GOWN DISPOSABLE) ×2 IMPLANT
GOWN STRL REUS W/TWL LRG LVL3 (GOWN DISPOSABLE) ×1
GOWN STRL REUS W/TWL XL LVL3 (GOWN DISPOSABLE) ×1
GOWN STRL SURGICAL XL XLNG (GOWN DISPOSABLE) ×2 IMPLANT
MANIFOLD NEPTUNE II (INSTRUMENTS) ×2 IMPLANT
PACK ARTHROSCOPY DSU (CUSTOM PROCEDURE TRAY) ×2 IMPLANT
PACK BASIN DAY SURGERY FS (CUSTOM PROCEDURE TRAY) ×2 IMPLANT
PAD COLD SHLDR WRAP-ON (PAD) ×2 IMPLANT
PENCIL SMOKE EVACUATOR (MISCELLANEOUS) ×2 IMPLANT
SHEET MEDIUM DRAPE 40X70 STRL (DRAPES) ×2 IMPLANT
SUT ETHILON 3 0 PS 1 (SUTURE) ×2 IMPLANT
TOWEL GREEN STERILE FF (TOWEL DISPOSABLE) ×2 IMPLANT
TUBING ARTHROSCOPY IRRIG 16FT (MISCELLANEOUS) ×2 IMPLANT
WATER STERILE IRR 1000ML POUR (IV SOLUTION) ×2 IMPLANT

## 2022-08-05 NOTE — Discharge Instructions (Addendum)

## 2022-08-05 NOTE — Anesthesia Preprocedure Evaluation (Signed)
Anesthesia Evaluation  Patient identified by MRN, date of birth, ID band Patient awake    Reviewed: Allergy & Precautions, H&P , NPO status , Patient's Chart, lab work & pertinent test results  Airway Mallampati: I   Neck ROM: full    Dental   Pulmonary neg pulmonary ROS   breath sounds clear to auscultation       Cardiovascular negative cardio ROS  Rhythm:regular Rate:Normal     Neuro/Psych  Headaches    GI/Hepatic ,GERD  ,,  Endo/Other    Renal/GU      Musculoskeletal  (+) Arthritis ,    Abdominal   Peds  Hematology   Anesthesia Other Findings   Reproductive/Obstetrics                             Anesthesia Physical Anesthesia Plan  ASA: 2  Anesthesia Plan: General   Post-op Pain Management:    Induction: Intravenous  PONV Risk Score and Plan: 3 and Ondansetron, Dexamethasone, Midazolam and Treatment may vary due to age or medical condition  Airway Management Planned: LMA  Additional Equipment:   Intra-op Plan:   Post-operative Plan: Extubation in OR  Informed Consent: I have reviewed the patients History and Physical, chart, labs and discussed the procedure including the risks, benefits and alternatives for the proposed anesthesia with the patient or authorized representative who has indicated his/her understanding and acceptance.     Dental advisory given  Plan Discussed with: Anesthesiologist, CRNA and Surgeon  Anesthesia Plan Comments:        Anesthesia Quick Evaluation

## 2022-08-05 NOTE — Anesthesia Procedure Notes (Signed)
Procedure Name: LMA Insertion Date/Time: 08/05/2022 2:27 PM  Performed by: Maaz Spiering, Ernesta Amble, CRNAPre-anesthesia Checklist: Patient identified, Emergency Drugs available, Suction available and Patient being monitored Patient Re-evaluated:Patient Re-evaluated prior to induction Oxygen Delivery Method: Circle system utilized Preoxygenation: Pre-oxygenation with 100% oxygen Induction Type: IV induction Ventilation: Mask ventilation without difficulty LMA: LMA inserted LMA Size: 4.0 Number of attempts: 1 Airway Equipment and Method: Bite block Placement Confirmation: positive ETCO2 Tube secured with: Tape Dental Injury: Teeth and Oropharynx as per pre-operative assessment

## 2022-08-05 NOTE — Transfer of Care (Signed)
Immediate Anesthesia Transfer of Care Note  Patient: Heidi Mccormick  Procedure(s) Performed: LEFT KNEE ARTHROSCOPY, LATERAL RELEASE, MICROFRACTURE (Left: Knee)  Patient Location: PACU  Anesthesia Type:General  Level of Consciousness: drowsy and patient cooperative  Airway & Oxygen Therapy: Patient Spontanous Breathing and Patient connected to face mask oxygen  Post-op Assessment: Report given to RN and Post -op Vital signs reviewed and stable  Post vital signs: Reviewed and stable  Last Vitals:  Vitals Value Taken Time  BP    Temp    Pulse 84 08/05/22 1515  Resp 13 08/05/22 1515  SpO2 99 % 08/05/22 1515  Vitals shown include unvalidated device data.  Last Pain:  Vitals:   08/05/22 1307  TempSrc: Oral  PainSc: 0-No pain      Patients Stated Pain Goal: 4 (66/59/93 5701)  Complications: No notable events documented.

## 2022-08-05 NOTE — H&P (Signed)
PREOPERATIVE H&P  Chief Complaint: left knee patellofemoral arthritis  HPI: Heidi Mccormick is a 22 y.o. female who presents for surgical treatment of left knee patellofemoral arthritis.  She denies any changes in medical history.  Past Medical History:  Diagnosis Date   Abnormal vaginal bleeding    Dr. Stann Mainland   Acid reflux    Acne vulgaris    Headache syndrome    summer 2022   History of community acquired pneumonia 05/2021   Menometrorrhagia    Started Yaz 01/30/19   Patellofemoral arthritis    bilat 2023   Vasovagal syncope    summer 2022   Past Surgical History:  Procedure Laterality Date   NO PAST SURGERIES     Social History   Socioeconomic History   Marital status: Single    Spouse name: Not on file   Number of children: 0   Years of education: Not on file   Highest education level: High school graduate  Occupational History    Comment: vet assistant  Tobacco Use   Smoking status: Never   Smokeless tobacco: Never  Vaping Use   Vaping Use: Never used  Substance and Sexual Activity   Alcohol use: Never   Drug use: Never   Sexual activity: Never  Other Topics Concern   Not on file  Social History Narrative   Single, no children.   Lives at home with parents as of 01/2019.   Educ: HS--[employer].   Occup: Warehouse manager   No T/A/Ds   Caffeine- coffee 1 c daily   Social Determinants of Health   Financial Resource Strain: Unknown (10/02/2021)   Overall Financial Resource Strain (CARDIA)    Difficulty of Paying Living Expenses: Patient refused  Food Insecurity: No Food Insecurity (10/02/2021)   Hunger Vital Sign    Worried About Running Out of Food in the Last Year: Never true    Ran Out of Food in the Last Year: Never true  Transportation Needs: No Transportation Needs (10/02/2021)   PRAPARE - Hydrologist (Medical): No    Lack of Transportation (Non-Medical): No  Physical Activity: Inactive (10/02/2021)   Exercise Vital Sign     Days of Exercise per Week: 2 days    Minutes of Exercise per Session: 0 min  Stress: No Stress Concern Present (10/02/2021)   [place]    Feeling of Stress : Not at all  Social Connections: Socially Isolated (10/02/2021)   Social Connection and Isolation Panel [NHANES]    Frequency of Communication with Friends and Family: More than three times a week    Frequency of Social Gatherings with Friends and Family: More than three times a week    Attends Religious Services: Never    Marine scientist or Organizations: No    Attends Music therapist: Not on file    Marital Status: Never married   Family History  Problem Relation Age of Onset   Miscarriages / Stillbirths Mother    Diabetes Father    Heart attack Father    Heart disease Father    High Cholesterol Father    High blood pressure Father    Asthma Brother    Diabetes Maternal Grandmother    Heart disease Maternal Grandmother    High Cholesterol Maternal Grandmother    High blood pressure Maternal Grandmother    Miscarriages / Stillbirths Maternal Grandmother    Breast cancer Maternal  Grandmother    Cancer Maternal Grandfather    COPD Maternal Grandfather    High Cholesterol Maternal Grandfather    High blood pressure Maternal Grandfather    Kidney disease Maternal Grandfather    Early death Paternal Grandfather    Heart attack Paternal Grandfather    Heart disease Paternal Grandfather    No Known Allergies Prior to Admission medications   Medication Sig Start Date End Date Taking? Authorizing Provider  Drospirenone (SLYND) 4 MG TABS TAKE 1 TABLET BY MOUTH ONCE DAILY 11/03/21  Yes   gabapentin (NEURONTIN) 300 MG capsule Take 1 capsule (300 mg total) by mouth at bedtime. 07/17/22  Yes Silverio Decamp, MD  ibuprofen (ADVIL) 800 MG tablet Take 1 tablet (800 mg total) by mouth every 8 (eight) hours as needed. 08/05/22  Yes Leandrew Koyanagi, MD  ondansetron (ZOFRAN) 4 MG tablet Take 1-2 tablets (4-8 mg total) by mouth every 8 (eight) hours as needed for nausea or vomiting. 08/05/22  Yes Leandrew Koyanagi, MD  oxyCODONE-acetaminophen (PERCOCET) 5-325 MG tablet Take 1-2 tablets by mouth 2 (two) times daily as needed for severe pain. 08/05/22  Yes Leandrew Koyanagi, MD  pantoprazole (PROTONIX) 40 MG tablet Take 1 tablet (40 mg total) by mouth daily. 01/23/22  Yes McGowen, Adrian Blackwater, MD     Positive ROS: All other systems have been reviewed and were otherwise negative with the exception of those mentioned in the HPI and as above.  Physical Exam: General: Alert, no acute distress Cardiovascular: No pedal edema Respiratory: No cyanosis, no use of accessory musculature GI: abdomen soft Skin: No lesions in the area of chief complaint Neurologic: Sensation intact distally Psychiatric: Patient is competent for consent with normal mood and affect Lymphatic: no lymphedema  MUSCULOSKELETAL: exam stable  Assessment: left knee patellofemoral arthritis  Plan: Plan for Procedure(s): LEFT KNEE ARTHROSCOPY, LATERAL RELEASE, MICROFRACTURE  The risks benefits and alternatives were discussed with the patient including but not limited to the risks of nonoperative treatment, versus surgical intervention including infection, bleeding, nerve injury,  blood clots, cardiopulmonary complications, morbidity, mortality, among others, and they were willing to proceed.   Eduard Roux, MD 08/05/2022 1:34 PM

## 2022-08-05 NOTE — Op Note (Signed)
   Surgery Date: 08/05/2022  PREOPERATIVE DIAGNOSES:  1. Left knee patellofemoral arthritis and maltracking  POSTOPERATIVE DIAGNOSES:  same  PROCEDURES PERFORMED:  1. Left knee arthroscopy with lateral release 2. Left knee arthroscopy with abrasion arthroplasty of patella  SURGEON: N. Eduard Roux, M.D.  ASSIST: Ciro Backer [place], [place]; necessary for the timely completion of procedure and due to complexity of procedure.  ANESTHESIA:  general  FLUIDS: Per anesthesia record.   ESTIMATED BLOOD LOSS: minimal  DESCRIPTION OF PROCEDURE: Ms. Weyland is a 22 y.o.-year-old female with above mentioned conditions. Full discussion held regarding risks benefits alternatives and complications related surgical intervention. Conservative care options reviewed. All questions answered.  The patient was identified in the preoperative holding area and the operative extremity was marked. The patient was brought to the operating room and transferred to operating table in a supine position. Satisfactory general anesthesia was induced by anesthesiology.    Standard anterolateral, anteromedial arthroscopy portals were obtained. The anteromedial portal was obtained with a spinal needle for localization under direct visualization with subsequent diagnostic findings.   No chondromalacia was encountered in the femoral-tibial compartments.  Medial and lateral menisci were unremarkable.  Cruciates were unremarkable.  Knee was placed into extension and limited synovectomy was performed for visualization.  We found the focal area of chondromalacia towards the superior apex of the patella that corresponded to the MRI.  Given the location was not able to safely perform microfracture without damaging the surrounding cartilage therefore I made the decision to perform a abrasion arthroplasty.  The remnant of the chondral tissue was first debrided away with a full-thickness shaver and then I took a bur to the area down to  bleeding subchondral bone.  The borders of the defect were gently smoothed with the full radius shaver.  We then turned our attention to the lateral release.  A spinal needle was used to mark the superior extent of the patella.  Lateral release was then performed under arthroscopic visualization using the underwater Bovie.  Gutters were checked for loose bodies.  Excess fluid was removed from the knee joint.  Incisions were closed with interrupted nylon sutures.  Sterile dressings were applied.  Patient tolerated procedure well had no immediate complications.  Suprapatellar pouch and gutters: no synovitis or debris. Patella chondral surface: Grade 4 Trochlear chondral surface: Grade 0 Patellofemoral tracking: lateral Medial meniscus: normal.  Medial femoral condyle weight bearing surface: Grade 0 Medial tibial plateau: Grade 0 Anterior cruciate ligament:stable Posterior cruciate ligament:stable Lateral meniscus: normal.   Lateral femoral condyle weight bearing surface: Grade 0 Lateral tibial plateau: Grade 0  DISPOSITION: The patient was awakened from general anesthetic, extubated, taken to the recovery room in medically stable condition, no apparent complications. The patient may be weightbearing as tolerated to the operative lower extremity.  Range of motion of right knee as tolerated.  Azucena Cecil, MD [facility] 3:14 PM

## 2022-08-06 ENCOUNTER — Encounter (HOSPITAL_BASED_OUTPATIENT_CLINIC_OR_DEPARTMENT_OTHER): Payer: Self-pay | Admitting: Orthopaedic Surgery

## 2022-08-06 NOTE — Anesthesia Postprocedure Evaluation (Signed)
Anesthesia Post Note  Patient: Heidi Mccormick  Procedure(s) Performed: LEFT KNEE ARTHROSCOPY, LATERAL RELEASE, MICROFRACTURE (Left: Knee)     Patient location during evaluation: PACU Anesthesia Type: General Level of consciousness: awake and alert Pain management: pain level controlled Vital Signs Assessment: post-procedure vital signs reviewed and stable Respiratory status: spontaneous breathing, nonlabored ventilation, respiratory function stable and patient connected to nasal cannula oxygen Cardiovascular status: blood pressure returned to baseline and stable Postop Assessment: no apparent nausea or vomiting Anesthetic complications: no   No notable events documented.  Last Vitals:  Vitals:   08/05/22 1545 08/05/22 1615  BP: 121/80 117/80  Pulse: 83 83  Resp: 14 16  Temp:  36.7 C  SpO2: 99% 99%    Last Pain:  Vitals:   08/05/22 1615  TempSrc:   PainSc: [place]

## 2022-08-13 ENCOUNTER — Ambulatory Visit (INDEPENDENT_AMBULATORY_CARE_PROVIDER_SITE_OTHER): Payer: 59 | Admitting: Orthopaedic Surgery

## 2022-08-13 ENCOUNTER — Encounter: Payer: 59 | Admitting: Orthopaedic Surgery

## 2022-08-13 DIAGNOSIS — M1710 Unilateral primary osteoarthritis, unspecified knee: Secondary | ICD-10-CM | POA: Diagnosis not present

## 2022-08-13 NOTE — Progress Notes (Signed)
   Post-Op Visit Note   Patient: CHRISANNA MISHRA           Date of Birth: 2000/09/12           MRN: [id number] Visit Date: 08/13/2022 PCP: Tammi Sou, MD   Assessment & Plan:  Chief Complaint:  Chief Complaint  Patient presents with   Left Knee - Routine Post Op   Visit Diagnoses:  1. Patellofemoral arthritis     Plan: [place] is 1 week status post left knee scope with lateral release and abrasion arthroplasty patella.  Overall doing well without any significant complaints.  Examination of left knee shows healed surgical incisions.  No signs of infection.  Minimal effusion.  Expected quad weakness.  Range of motion progressing nicely.  Sutures removed and Steri-Strips applied.  PSO brace provided.  Will make referral to outpatient PT at [place].  Recheck in 3 weeks.  Continue out of work.  Follow-Up Instructions: Return in about 3 weeks (around 09/03/2022).   Orders:  Orders Placed This Encounter  Procedures   Ambulatory referral to Physical Therapy   No orders of the defined types were placed in this encounter.   Imaging: No results found.  PMFS History: Patient Active Problem List   Diagnosis Date Noted   Polyarthralgia 07/17/2022   Neck and shoulder pain, left 05/15/2022   Chronic low back pain 05/15/2022   Patellofemoral arthritis 11/10/2021   Migraine 05/29/2021   Past Medical History:  Diagnosis Date   Abnormal vaginal bleeding    Dr. Stann Mainland   Acid reflux    Acne vulgaris    Headache syndrome    summer 2022   History of community acquired pneumonia 05/2021   Menometrorrhagia    Started Yaz 01/30/19   Patellofemoral arthritis    bilat 2023   Vasovagal syncope    summer 2022    Family History  Problem Relation Age of Onset   Miscarriages / [place] Mother    Diabetes Father    Heart attack Father    Heart disease Father    High Cholesterol Father    High blood pressure Father    Asthma Brother    Diabetes Maternal  Grandmother    Heart disease Maternal Grandmother    High Cholesterol Maternal Grandmother    High blood pressure Maternal Grandmother    Miscarriages / Stillbirths Maternal Grandmother    Breast cancer Maternal Grandmother    Cancer Maternal Grandfather    COPD Maternal Grandfather    High Cholesterol Maternal Grandfather    High blood pressure Maternal Grandfather    Kidney disease Maternal Grandfather    Early death Paternal Grandfather    Heart attack Paternal Grandfather    Heart disease Paternal Grandfather     Past Surgical History:  Procedure Laterality Date   KNEE ARTHROSCOPY WITH LATERAL RELEASE Left 08/05/2022   Procedure: LEFT KNEE ARTHROSCOPY, LATERAL RELEASE, MICROFRACTURE;  Surgeon: Leandrew Koyanagi, MD;  Location: [facility];  Service: Orthopedics;  Laterality: Left;   NO PAST SURGERIES     Social History   Occupational History    Comment: Warehouse manager  Tobacco Use   Smoking status: Never   Smokeless tobacco: Never  Vaping Use   Vaping Use: Never used  Substance and Sexual Activity   Alcohol use: Never   Drug use: Never   Sexual activity: Never

## 2022-08-17 NOTE — Therapy (Signed)
OUTPATIENT PHYSICAL THERAPY LOWER EXTREMITY EVALUATION   Patient Name: Heidi Mccormick MRN: [id number] DOB:05-01-2001, 22 y.o., female Today's Date: 08/18/2022  END OF SESSION:  PT End of Session - 08/18/22 1706     Visit Number 1    Number of Visits 16    Date for PT Re-Evaluation 10/06/22    PT Start Time 1400    PT Stop Time 1445    PT Time Calculation (min) 45 min             Past Medical History:  Diagnosis Date   Abnormal vaginal bleeding    Dr. Stann Mainland   Acid reflux    Acne vulgaris    Headache syndrome    summer 2022   History of community acquired pneumonia 05/2021   Menometrorrhagia    Started Yaz 01/30/19   Patellofemoral arthritis    bilat 2023   Vasovagal syncope    summer 2022   Past Surgical History:  Procedure Laterality Date   KNEE ARTHROSCOPY WITH LATERAL RELEASE Left 08/05/2022   Procedure: LEFT KNEE ARTHROSCOPY, LATERAL RELEASE, MICROFRACTURE;  Surgeon: Leandrew Koyanagi, MD;  Location: [place];  Service: Orthopedics;  Laterality: Left;   NO PAST SURGERIES     Patient Active Problem List   Diagnosis Date Noted   Polyarthralgia 07/17/2022   Neck and shoulder pain, left 05/15/2022   Chronic low back pain 05/15/2022   Patellofemoral arthritis 11/10/2021   Migraine 05/29/2021    PCP: Dr Tammi Sou  REFERRING PROVIDER: Dr Frankey Shown  REFERRING DIAG: Lt patellofemoral arthritis   THERAPY DIAG:  Muscle weakness (generalized) - Plan: PT plan of care cert/re-cert  Chronic pain of left knee - Plan: PT plan of care cert/re-cert  Chondromalacia of right patellofemoral joint - Plan: PT plan of care cert/re-cert  Other abnormalities of gait and mobility - Plan: PT plan of care cert/re-cert  Rationale for Evaluation and Treatment: Rehabilitation  ONSET DATE: 08/05/22  SUBJECTIVE:   SUBJECTIVE STATEMENT: Patient reports that she has had pain and problems in the Lt knee for the past 10 years with symptoms increasing in the  past 2 years, especially in the past 8-10 months. She underwent elective Lt knee surgery 08/05/22 knee arthroscopy, lateral release, microfracture.   PERTINENT HISTORY: Long history of Lt > Rt knee pain; LBP for 2 years  PAIN:  Are you having pain? Yes: NPRS scale: 1/10 Pain location: Lt knee Lt side of knee  Pain description: throbbing Aggravating factors: brace; sitting; lying down  Relieving factors: ice; OTC meds   PRECAUTIONS: Other: post Lt knee sx 08/05/22 - no knee flexion > 90 degrees; in brace for gait   WEIGHT BEARING RESTRICTIONS: No  FALLS:  Has patient fallen in last 6 months? 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 Degenerative changes of the posterior horn of the medial meniscus without discrete tear. 2. Mild patellofemoral arthritic changes with focal full-thickness cartilage defect at the patellar apex. 3. Cruciate and collateral ligaments are intact. Quadriceps tendon and patellar tendon are intact. No evidence of fracture or osteonecrosis. Rt knee:  IMPRESSION: 1.  Mild arthritic changes of the patellofemoral compartment. 2. Cruciate and collateral ligaments are intact. Medial and lateral menisci are intact. Quadriceps tendon and patellar tendon are intact. No evidence of fracture or osteonecrosis. 3.  Small benign osteochondroma on the medial aspect of the tibia.   PATIENT SURVEYS:  FOTO 37 goal 36  COGNITION: Overall cognitive status: Within functional limits for tasks assessed     SENSATION: Numbness in the lateral knee and incisional area   EDEMA:   Circumferential: 1 cm larger Lt LE compared to Rt   MUSCLE LENGTH: Hamstrings: Right 90 deg; Left 65 deg  POSTURE: rounded shoulders, forward head, flexed trunk , and weight shift right  PALPATION: Tightness and tenderness peripatellar Lt knee laterally > medially with light palpation   LOWER EXTREMITY ROM:  Active ROM Right eval Left eval  Hip flexion    Hip extension    Hip abduction    Hip adduction    Hip internal rotation    Hip external rotation    Knee flexion 148 90 limited by MD order   Knee extension 7 -5 mild pain   Ankle dorsiflexion    Ankle plantarflexion    Ankle inversion    Ankle eversion     (Blank rows = not tested)  LOWER EXTREMITY MMT:  MMT Right eval Left eval  Hip flexion 5 4  Hip extension 5- 4+  Hip abduction 4+ 4  Hip adduction 4+ 4  Hip internal rotation    Hip external rotation    Knee flexion 5 NT  Knee extension 5 NT  Ankle dorsiflexion    Ankle plantarflexion    Ankle inversion    Ankle eversion     (Blank rows = not tested)  GAIT: Distance walked: 40 ft Assistive device utilized: None Level of assistance: Complete Independence Comments: limp Lt LE in wt bearing Lt LE - longer step Lt compared to Rt  - Lt LE in brace therefore decreased mobility, limited flexion and extension    TODAY'S TREATMENT:                                                                                                                              DATE: 08/18/22  [facility] Adult PT Treatment:  Therapeutic Exercise: 4 part core 10 sec x 10 supine LE's in elevation for ankle pumps, circles CW/CCW, ankle alphabet x 10 each  Quad set 5 sec x 10 pillow under knee  SLR w/ LE in ER 3 sec x 10 w/ assist of strap  Lt Hip abduction Lt sidelying  Manual Therapy: To add scar massage and release work lateral quad as indicated  Neuromuscular re-ed:  Therapeutic Activity:  Gait:  Modalities: To ice at home Self Care: Discussion of ice and elevation to  address edema   PATIENT EDUCATION:  Education details: POC; HEP  Person educated: Patient Education method: Explanation, Demonstration, Tactile cues, Verbal cues, and Handouts Education comprehension:  verbalized understanding, returned demonstration, verbal cues required, tactile cues required, and needs further education  HOME EXERCISE PROGRAM: Access Code: [email or web address] URL: [email or web address] Date: 08/18/2022 Prepared by: Gillermo Murdoch  Exercises - Supine Transversus Abdominis Bracing with Pelvic Floor Contraction  - 2 x daily - 7 x weekly - 1 sets - 10 reps - 10sec  hold - Ankle Pumps in Elevation  - 2 x daily - 7 x weekly - 1 sets - 10-20 reps - Ankle Circles in Elevation  - 2 x daily - 7 x weekly - 1 sets - 10-15 reps - Ankle Alphabet in Elevation  - 2 x daily - 7 x weekly - 1 sets - 3 reps - Supine Quad Set  - 2 x daily - 7 x weekly - 1-2 sets - 10 reps - 3 sec  hold - Straight Leg Raise with External Rotation  - 1-2 x daily - 7 x weekly - 1-2 sets - 10 reps - 3-5 sec  hold - Sidelying Hip Adduction  - 1-2 x daily - 7 x weekly - 1-2 sets - 10 reps - 3 sec  hold  ASSESSMENT:  CLINICAL IMPRESSION: Patient is a 22 y.o. female who was seen today for physical therapy evaluation and treatment for Lt patellofemoral arthritis s/p Lt knee arthroscopy with lateral release and microfracture 08/05/22. She has a long history of Lt knee pain and dysfunction. Patient presents with brace Lt knee; abnormal gait pattern; decreased wt bearing Lt LE; decreased strength and ROM Lt knee; some Rt knee and LB pain possibly from compensatory movement patterns due to Lt LE pain.   OBJECTIVE IMPAIRMENTS: Abnormal gait, decreased activity tolerance, decreased balance, decreased endurance, decreased mobility, difficulty walking, decreased ROM, decreased strength, increased edema, increased fascial restrictions, impaired flexibility, improper body mechanics, postural dysfunction, and pain.   ACTIVITY  LIMITATIONS: carrying, lifting, bending, sitting, standing, squatting, sleeping, stairs, transfers, bed mobility, bathing, dressing, and locomotion level  PARTICIPATION LIMITATIONS: meal prep, cleaning, laundry, driving, shopping, community activity, occupation, and yard work  PERSONAL FACTORS: Age, Past/current experiences, Profession, Time since onset of injury/illness/exacerbation, and comorbidities: chronic nature of dysfunction; fitness level are also affecting patient's functional outcome.   REHAB POTENTIAL: Good  CLINICAL DECISION MAKING: Stable/uncomplicated  EVALUATION COMPLEXITY: Low   GOALS: Goals reviewed with patient? Yes  SHORT TERM GOALS: Target date: 09/15/2022  Independent in initial HEP  Baseline: Goal status: INITIAL  2.  Independent gait with least restrictive device for functional distances in home and necessary community distances  Baseline:  Goal status: INITIAL  LONG TERM GOALS: Target date: 10/13/2022  Increase AROM Lt knee to 0 degrees extension and 125 degrees flexion Baseline:  Goal status: INITIAL  2.  Increase strength bilat LE's 4+/5 to 5/5  Baseline:  Goal status: INITIAL  3.  Normal gait pattern for community distances  Baseline:  Goal status: INITIAL  4.  Increase functional activities allowing patient to return to work and full activities  Baseline:  Goal status: INITIAL  5.  Independent in HEP including aquatic therapy program  Baseline:  Goal status: INITIAL  6.  Improve functional limitation to 61 Baseline:  Goal status: INITIAL   PLAN:  PT FREQUENCY: 2x/week  PT DURATION: 8 weeks  PLANNED INTERVENTIONS: Therapeutic exercises, Therapeutic activity, Neuromuscular re-education, Balance training, Gait training, Patient/Family education, Self Care, Joint mobilization, Stair training, DME instructions, Aquatic Therapy, Dry Needling, Electrical stimulation, Cryotherapy, Moist heat, Taping, Vasopneumatic device, Ultrasound,  Ionotophoresis 4mg /ml Dexamethasone, Manual therapy, and Re-evaluation  PLAN  FOR NEXT SESSION: review and progress HEP; manual work, DN, modalities as indicated    [employer], PT 08/18/2022, 5:10 PM

## 2022-08-18 ENCOUNTER — Encounter: Payer: Self-pay | Admitting: Rehabilitative and Restorative Service Providers"

## 2022-08-18 ENCOUNTER — Other Ambulatory Visit: Payer: Self-pay

## 2022-08-18 ENCOUNTER — Ambulatory Visit: Payer: 59 | Attending: Orthopaedic Surgery | Admitting: Rehabilitative and Restorative Service Providers"

## 2022-08-18 DIAGNOSIS — M1710 Unilateral primary osteoarthritis, unspecified knee: Secondary | ICD-10-CM | POA: Diagnosis not present

## 2022-08-18 DIAGNOSIS — R2689 Other abnormalities of gait and mobility: Secondary | ICD-10-CM | POA: Insufficient documentation

## 2022-08-18 DIAGNOSIS — M6281 Muscle weakness (generalized): Secondary | ICD-10-CM

## 2022-08-18 DIAGNOSIS — M25562 Pain in left knee: Secondary | ICD-10-CM | POA: Insufficient documentation

## 2022-08-18 DIAGNOSIS — M2241 Chondromalacia patellae, right knee: Secondary | ICD-10-CM | POA: Insufficient documentation

## 2022-08-18 DIAGNOSIS — G8929 Other chronic pain: Secondary | ICD-10-CM | POA: Diagnosis not present

## 2022-08-19 ENCOUNTER — Other Ambulatory Visit (HOSPITAL_COMMUNITY): Payer: Self-pay

## 2022-08-20 ENCOUNTER — Ambulatory Visit: Payer: 59 | Admitting: Rehabilitative and Restorative Service Providers"

## 2022-08-20 ENCOUNTER — Encounter: Payer: Self-pay | Admitting: Rehabilitative and Restorative Service Providers"

## 2022-08-20 ENCOUNTER — Other Ambulatory Visit (HOSPITAL_COMMUNITY): Payer: Self-pay

## 2022-08-20 ENCOUNTER — Other Ambulatory Visit: Payer: Self-pay

## 2022-08-20 DIAGNOSIS — M2241 Chondromalacia patellae, right knee: Secondary | ICD-10-CM

## 2022-08-20 DIAGNOSIS — G8929 Other chronic pain: Secondary | ICD-10-CM | POA: Diagnosis not present

## 2022-08-20 DIAGNOSIS — M25562 Pain in left knee: Secondary | ICD-10-CM | POA: Diagnosis not present

## 2022-08-20 DIAGNOSIS — R2689 Other abnormalities of gait and mobility: Secondary | ICD-10-CM

## 2022-08-20 DIAGNOSIS — M6281 Muscle weakness (generalized): Secondary | ICD-10-CM

## 2022-08-20 DIAGNOSIS — M1710 Unilateral primary osteoarthritis, unspecified knee: Secondary | ICD-10-CM | POA: Diagnosis not present

## 2022-08-20 MED ORDER — SLYND 4 MG PO TABS
1.0000 | ORAL_TABLET | Freq: Every day | ORAL | 3 refills | Status: DC
  Filled 2022-08-20 (×2): qty 84, 84d supply, fill #0

## 2022-08-20 NOTE — Therapy (Signed)
OUTPATIENT PHYSICAL THERAPY LOWER EXTREMITY TREATMENT   Patient Name: Heidi Mccormick MRN: [id number] DOB:Jan 30, 2001, 22 y.o., female Today's Date: 08/20/2022  END OF SESSION:  PT End of Session - 08/20/22 1140     Visit Number 2    Number of Visits 16    Date for PT Re-Evaluation 10/06/22    PT Start Time 1140    PT Stop Time 1225    PT Time Calculation (min) 45 min    Activity Tolerance Patient tolerated treatment well             Past Medical History:  Diagnosis Date   Abnormal vaginal bleeding    Dr. Aldona Bar   Acid reflux    Acne vulgaris    Headache syndrome    summer 2022   History of community acquired pneumonia 05/2021   Menometrorrhagia    Started Yaz 01/30/19   Patellofemoral arthritis    bilat 2023   Vasovagal syncope    summer 2022   Past Surgical History:  Procedure Laterality Date   KNEE ARTHROSCOPY WITH LATERAL RELEASE Left 08/05/2022   Procedure: LEFT KNEE ARTHROSCOPY, LATERAL RELEASE, MICROFRACTURE;  Surgeon: Tarry Kos, MD;  Location: [facility];  Service: Orthopedics;  Laterality: Left;   NO PAST SURGERIES     Patient Active Problem List   Diagnosis Date Noted   Polyarthralgia 07/17/2022   Neck and shoulder pain, left 05/15/2022   Chronic low back pain 05/15/2022   Patellofemoral arthritis 11/10/2021   Migraine 05/29/2021    PCP: Dr Jeoffrey Massed  REFERRING PROVIDER: Dr Gershon Mussel  REFERRING DIAG: Lt patellofemoral arthritis   THERAPY DIAG:  Muscle weakness (generalized)  Chronic pain of left knee  Chondromalacia of right patellofemoral joint  Other abnormalities of gait and mobility  Rationale for Evaluation and Treatment: Rehabilitation  ONSET DATE: 08/05/22  SUBJECTIVE:   SUBJECTIVE STATEMENT: Patient reports that she has some continued mild pain on an ongoing basis.   PERTINENT HISTORY: Long history of Lt > Rt knee pain; LBP for 2 years; pain and problems in the Lt knee for the past 10 years with  symptoms increasing in the past 2 years, especially in the past 8-10 months. She underwent elective Lt knee surgery 08/05/22 knee arthroscopy, lateral release, microfracture.  PAIN:  Are you having pain? Yes: NPRS scale: 1/10 Pain location: Lt knee Lt side of knee  Pain description: throbbing Aggravating factors: brace; sitting; lying down  Relieving factors: ice; OTC meds   PRECAUTIONS: Other: post Lt knee sx 08/05/22 - no knee flexion > 90 degrees; in brace for gait   WEIGHT BEARING RESTRICTIONS: No  FALLS:  Has patient fallen in last 6 months? 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 Degenerative changes of the posterior horn of the medial meniscus without discrete tear. 2. Mild patellofemoral arthritic changes with focal full-thickness cartilage defect at the patellar apex. 3. Cruciate and collateral ligaments are intact. Quadriceps tendon and patellar tendon are intact. No evidence of fracture or osteonecrosis. Rt knee: IMPRESSION: 1.  Mild arthritic changes of the patellofemoral compartment. 2. Cruciate  and collateral ligaments are intact. Medial and lateral menisci are intact. Quadriceps tendon and patellar tendon are intact. No evidence of fracture or osteonecrosis. 3.  Small benign osteochondroma on the medial aspect of the tibia.   PATIENT SURVEYS:  FOTO 39 goal 54  COGNITION: Overall cognitive status: Within functional limits for tasks assessed     SENSATION: Numbness in the lateral knee and incisional area   EDEMA:  Circumferential: 1 cm larger Lt LE compared to Rt    MUSCLE LENGTH: Hamstrings: Right 90 deg; Left 65 deg  POSTURE: rounded shoulders, forward head, flexed trunk , and weight shift right  PALPATION: Tightness and tenderness peripatellar Lt knee laterally > medially with light palpation   LOWER EXTREMITY ROM:  Active ROM Right eval Left eval  Hip flexion    Hip extension    Hip abduction    Hip adduction    Hip internal rotation    Hip external rotation    Knee flexion 148 90 limited by MD order   Knee extension 7 -5 mild pain   Ankle dorsiflexion    Ankle plantarflexion    Ankle inversion    Ankle eversion     (Blank rows = not tested)  LOWER EXTREMITY MMT:  MMT Right eval Left eval  Hip flexion 5 4  Hip extension 5- 4+  Hip abduction 4+ 4  Hip adduction 4+ 4  Hip internal rotation    Hip external rotation    Knee flexion 5 NT  Knee extension 5 NT  Ankle dorsiflexion    Ankle plantarflexion    Ankle inversion    Ankle eversion     (Blank rows = not tested)  GAIT: Distance walked: 40 ft Assistive device utilized: None Level of assistance: Complete Independence Comments: limp Lt LE in wt bearing Lt LE - longer step Lt compared to Rt  - Lt LE in brace therefore decreased mobility, limited flexion and extension    TODAY'S TREATMENT:   DATE: 08/20/22  [facility] Adult PT Treatment:  Therapeutic Exercise: Nustep L5 x 5 min for warm up and ROM  4 part core 10 sec x 10 supine(w/all exercises)  Quad set 5 sec x 10 pillow under knee  SLR w/ LE in ER 3 sec x 10 w/assist of strap SAQ 10 sec x 10   Hamstring stretch supine with strap 30 sec x 1  Lt Hip adduction Lt sidelying  Adductor squeeze w/ball 10 sec x 10 hooklying Prone glut 10 sec x 10  Prone hip ext 10 sec x 10  Standing w/out brace for equal wt bearing, wt shifting side to side and stagger step each foot fwd 1-2 min each  Manual Therapy: Scar massage and release work lateral quad LE supported on pillow working on deep tissue release to pt tolerance at  superior lateral patella at insertion of lateral quad  Neuromuscular re-ed: Working on recruitment of medial quad in supported knee extension with visible and palpable activation of medial quad - pt to continue for home  Therapeutic Activity: Desentization Lt patellar area/distal thigh Gait: Gait w/ even steps slowing speed to work on wt shift  Modalities: To ice at home Self Care: Scar massage; myofacial release lateral quad  DATE: 08/18/22  [facility] Adult PT Treatment:  Therapeutic Exercise: 4 part core 10 sec x 10 supine LE's in elevation for ankle pumps, circles CW/CCW, ankle alphabet x 10 each  Quad set 5 sec x 10 pillow under knee  SLR w/ LE in ER 3 sec x 10 w/ assist of strap  Lt Hip adduction Lt sidelying  Manual Therapy: To add scar massage and release work lateral quad as indicated  Modalities: To ice at home Self Care: Discussion of ice and elevation to address edema   PATIENT EDUCATION:  Education details: POC; HEP  Person educated: Patient Education method: Explanation, Demonstration, Tactile cues, Verbal cues, and Handouts Education comprehension: verbalized understanding, returned demonstration, verbal cues required, tactile cues required, and needs further education  HOME EXERCISE PROGRAM: Access Code: [email or web address] URL: [email or web address] Date: 08/20/2022 Prepared by: Gillermo Murdoch  Exercises - Supine Transversus Abdominis Bracing with Pelvic Floor Contraction  - 2 x daily - 7 x weekly - 1 sets - 10 reps - 10sec  hold - Ankle Pumps in Elevation  - 2 x daily - 7 x weekly - 1 sets - 10-20 reps - Ankle Circles in Elevation  - 2 x daily - 7 x weekly - 1 sets - 10-15 reps - Ankle Alphabet in Elevation  - 2 x daily - 7 x weekly - 1 sets - 3 reps - Supine Quad Set  - 2 x daily - 7 x weekly - 1-2 sets - 10 reps - 3 sec  hold - Straight  Leg Raise with External Rotation  - 1-2 x daily - 7 x weekly - 1-2 sets - 10 reps - 3-5 sec  hold - Sidelying Hip Adduction  - 1-2 x daily - 7 x weekly - 1-2 sets - 10 reps - 3 sec  hold - Prone Hip Extension  - 2 x daily - 7 x weekly - 1 sets - 10 reps - 3 sec  hold - Supine Hip Adduction Isometric with Ball  - 2 x daily - 7 x weekly - 1 sets - 10 reps - 10 sec  hold - Standing Weight Shift Side to Side  - 2 x daily - 7 x weekly - 1 sets - 3 reps - 30 sec  hold - Hooklying Hamstring Stretch with Strap  - 2 x daily - 7 x weekly - 1 sets - 3 reps - 30 sec  hold  Patient Education - Scar Massage ASSESSMENT:  CLINICAL IMPRESSION: Good gains in movement tolerance. Progressed exercises well with focus on ROM and muscle activation attempting to facilitate medial quad with exercises. Added scar massage and desentization for Lt patellar area with good response. Added weight bearing and wt shift activities standing without brace.   OBJECTIVE IMPAIRMENTS: Patient presents with brace Lt knee; abnormal gait pattern; decreased wt bearing Lt LE; decreased strength and ROM Lt knee; some Rt knee and LB pain possibly from compensatory movement patterns due to Lt LE pain. Abnormal gait, decreased activity tolerance, decreased balance, decreased endurance, decreased mobility, difficulty walking, decreased ROM, decreased strength, increased edema, increased fascial restrictions, impaired flexibility, improper body mechanics, postural dysfunction, and pain.   GOALS: Goals reviewed with patient? Yes  SHORT TERM GOALS: Target date: 09/15/2022  Independent in initial HEP  Baseline: Goal status: INITIAL  2.  Independent gait with least restrictive device for functional distances in home and necessary community distances  Baseline:  Goal status: INITIAL  LONG TERM GOALS: Target date: 10/13/2022  Increase AROM Lt  knee to 0 degrees extension and 125 degrees flexion Baseline:  Goal status: INITIAL  2.  Increase  strength bilat LE's 4+/5 to 5/5  Baseline:  Goal status: INITIAL  3.  Normal gait pattern for community distances  Baseline:  Goal status: INITIAL  4.  Increase functional activities allowing patient to return to work and full activities  Baseline:  Goal status: INITIAL  5.  Independent in HEP including aquatic therapy program  Baseline:  Goal status: INITIAL  6.  Improve functional limitation to 61 Baseline:  Goal status: INITIAL   PLAN:  PT FREQUENCY: 2x/week  PT DURATION: 8 weeks  PLANNED INTERVENTIONS: Therapeutic exercises, Therapeutic activity, Neuromuscular re-education, Balance training, Gait training, Patient/Family education, Self Care, Joint mobilization, Stair training, DME instructions, Aquatic Therapy, Dry Needling, Electrical stimulation, Cryotherapy, Moist heat, Taping, Vasopneumatic device, Ultrasound, Ionotophoresis 4mg /ml Dexamethasone, Manual therapy, and Re-evaluation  PLAN FOR NEXT SESSION: review and progress HEP; manual work, DN, modalities as indicated Will benefit from trial of DN lateral quad and maye hamstrings to release lateral tightness Lt LE    [place], PT 08/20/2022, 11:40 AM

## 2022-08-25 ENCOUNTER — Encounter: Payer: Self-pay | Admitting: Rehabilitative and Restorative Service Providers"

## 2022-08-25 ENCOUNTER — Ambulatory Visit: Payer: 59 | Admitting: Rehabilitative and Restorative Service Providers"

## 2022-08-25 DIAGNOSIS — R2689 Other abnormalities of gait and mobility: Secondary | ICD-10-CM

## 2022-08-25 DIAGNOSIS — M2241 Chondromalacia patellae, right knee: Secondary | ICD-10-CM | POA: Diagnosis not present

## 2022-08-25 DIAGNOSIS — M1710 Unilateral primary osteoarthritis, unspecified knee: Secondary | ICD-10-CM | POA: Diagnosis not present

## 2022-08-25 DIAGNOSIS — M6281 Muscle weakness (generalized): Secondary | ICD-10-CM

## 2022-08-25 DIAGNOSIS — G8929 Other chronic pain: Secondary | ICD-10-CM | POA: Diagnosis not present

## 2022-08-25 DIAGNOSIS — M25562 Pain in left knee: Secondary | ICD-10-CM | POA: Diagnosis not present

## 2022-08-25 NOTE — Therapy (Signed)
OUTPATIENT PHYSICAL THERAPY LOWER EXTREMITY TREATMENT   Patient Name: CACHE DECOURSEY MRN: [id number] DOB:Jan 04, 2001, 22 y.o., female Today's Date: 08/25/2022  END OF SESSION:  PT End of Session - 08/25/22 1149     Visit Number 3    Number of Visits 16    Date for PT Re-Evaluation 10/06/22    PT Start Time 1146    PT Stop Time 1230    PT Time Calculation (min) 44 min             Past Medical History:  Diagnosis Date   Abnormal vaginal bleeding    Dr. Aldona Bar   Acid reflux    Acne vulgaris    Headache syndrome    summer 2022   History of community acquired pneumonia 05/2021   Menometrorrhagia    Started Yaz 01/30/19   Patellofemoral arthritis    bilat 2023   Vasovagal syncope    summer 2022   Past Surgical History:  Procedure Laterality Date   KNEE ARTHROSCOPY WITH LATERAL RELEASE Left 08/05/2022   Procedure: LEFT KNEE ARTHROSCOPY, LATERAL RELEASE, MICROFRACTURE;  Surgeon: Tarry Kos, MD;  Location: [facility];  Service: Orthopedics;  Laterality: Left;   NO PAST SURGERIES     Patient Active Problem List   Diagnosis Date Noted   Polyarthralgia 07/17/2022   Neck and shoulder pain, left 05/15/2022   Chronic low back pain 05/15/2022   Patellofemoral arthritis 11/10/2021   Migraine 05/29/2021    PCP: Dr Jeoffrey Massed  REFERRING PROVIDER: Dr Gershon Mussel  REFERRING DIAG: Lt patellofemoral arthritis   THERAPY DIAG:  Muscle weakness (generalized)  Chronic pain of left knee  Chondromalacia of right patellofemoral joint  Other abnormalities of gait and mobility  Rationale for Evaluation and Treatment: Rehabilitation  ONSET DATE: 08/05/22  SUBJECTIVE:   SUBJECTIVE STATEMENT: Patient reports that she has some continued mild pain on an ongoing basis. The skin around the knee is still burning in a couple of places. She has worked on Scientist, research (physical sciences). Brace irritates the knee. Working on her exercises at home.   PERTINENT HISTORY: Long history of  Lt > Rt knee pain; LBP for 2 years; pain and problems in the Lt knee for the past 10 years with symptoms increasing in the past 2 years, especially in the past 8-10 months. She underwent elective Lt knee surgery 08/05/22 knee arthroscopy, lateral release, microfracture.  PAIN:  Are you having pain? Yes: NPRS scale: 0/10 Pain location: Lt knee Lt side of knee  Pain description: throbbing Aggravating factors: brace; sitting; lying down  Relieving factors: ice; OTC meds   PRECAUTIONS: Other: post Lt knee sx 08/05/22 - no knee flexion > 90 degrees; in brace for gait   WEIGHT BEARING RESTRICTIONS: No  FALLS:  Has patient fallen in last 6 months? 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 Degenerative changes of the posterior horn of the medial meniscus without discrete tear. 2. Mild patellofemoral arthritic changes with focal full-thickness cartilage defect at the patellar apex. 3. Cruciate and collateral ligaments are intact. Quadriceps tendon and patellar tendon are intact. No  evidence of fracture or osteonecrosis. Rt knee: IMPRESSION: 1.  Mild arthritic changes of the patellofemoral compartment. 2. Cruciate and collateral ligaments are intact. Medial and lateral menisci are intact. Quadriceps tendon and patellar tendon are intact. No evidence of fracture or osteonecrosis. 3.  Small benign osteochondroma on the medial aspect of the tibia.   PATIENT SURVEYS:  FOTO 45 goal 54  COGNITION: Overall cognitive status: Within functional limits for tasks assessed     SENSATION: Numbness in the lateral  knee and incisional area   EDEMA:  Circumferential: 1 cm larger Lt LE compared to Rt   MUSCLE LENGTH: Hamstrings: Right 90 deg; Left 65 deg  POSTURE: rounded shoulders, forward head, flexed trunk , and weight shift right  PALPATION: Tightness and tenderness peripatellar Lt knee laterally > medially with light palpation   LOWER EXTREMITY ROM:  Active ROM Right eval Left eval  Hip flexion    Hip extension    Hip abduction    Hip adduction    Hip internal rotation    Hip external rotation    Knee flexion 148 90 limited by MD order   Knee extension 7 -5 mild pain   Ankle dorsiflexion    Ankle plantarflexion    Ankle inversion    Ankle eversion     (Blank rows = not tested)  LOWER EXTREMITY MMT:  MMT Right eval Left eval  Hip flexion 5 4  Hip extension 5- 4+  Hip abduction 4+ 4  Hip adduction 4+ 4  Hip internal rotation    Hip external rotation    Knee flexion 5 NT  Knee extension 5 NT  Ankle dorsiflexion    Ankle plantarflexion    Ankle inversion    Ankle eversion     (Blank rows = not tested)  GAIT: Distance walked: 40 ft Assistive device utilized: None Level of assistance: Complete Independence Comments: limp Lt LE in wt bearing Lt LE - longer step Lt compared to Rt  - Lt LE in brace therefore decreased mobility, limited flexion and extension    TODAY'S TREATMENT:   DATE: 08/25/22  [facility] Adult PT Treatment:  Therapeutic Exercise: Nustep L6 x 6 min for warm up and ROM  4 part core 10 sec x 10 supine(w/all exercises)  Quad set 5 sec x 10 pillow under knee  SLR w/ LE in ER 3 sec x 10  SAQ 10 sec x 10   Rt sidelying Lt hip adduction 5 sec x 10  Hamstring stretch supine with strap 30 sec x 1  Lt Hip adduction Lt sidelying  Adductor squeeze w/ball 10 sec x 10 hooklying Prone glut set with hip extension sec x 10  Standing w/out brace for equal wt bearing, wt shifting side to side and stagger step each foot fwd 1-2 min each  Mini wall squat 10 sec x 10  ball btn knee Side steps red TB above knees x 10 ft x 8  Step up Lt 4 inch x 10 x 2  Lateral step up ~ 1 inch to tap heel x 10 x 2 (challenging - modified height on step with block) Manual Therapy: Trial of kinesotaping to areas of burning Lt lateral and medial knee.  HEP includes: Scar massage and release work lateral quad LE supported on pillow working on deep tissue release to pt tolerance at superior lateral patella at insertion of lateral quad  Neuromuscular re-ed: Working on recruitment of medial quad in supported knee extension with visible and palpable activation  of medial quad - pt to continue for home  Therapeutic Activity: HEP - Desentization Lt patellar area/distal thigh Gait: Gait w/ even steps slowing speed to work on wt shift x 2 laps in gym Modalities: To ice at home Self Care: Scar massage; myofacial release lateral quad   DATE: 08/20/22  [facility] Adult PT Treatment:  Therapeutic Exercise: Nustep L5 x 5 min for warm up and ROM  4 part core 10 sec x 10 supine(w/all exercises)  Quad set 5 sec x 10 pillow under knee  SLR w/ LE in ER 3 sec x 10 w/assist of strap SAQ 10 sec x 10   Hamstring stretch supine with strap 30 sec x 1  Lt Hip adduction Lt sidelying  Adductor squeeze w/ball 10 sec x 10 hooklying Prone glut 10 sec x 10  Prone hip ext 10 sec x 10  Standing w/out brace for equal wt bearing, wt shifting side to side and stagger step each foot fwd 1-2 min each  Manual Therapy: Scar massage and release work lateral quad LE supported on pillow working on deep tissue release to pt tolerance at superior lateral patella at insertion of lateral quad  Neuromuscular re-ed: Working on recruitment of medial quad in supported knee extension with visible and palpable activation of medial quad - pt to continue for home  Therapeutic Activity: Desentization Lt patellar area/distal thigh Gait: Gait w/ even steps slowing speed to work on wt shift  Modalities: To ice at home Self  Care: Scar massage; myofacial release lateral quad                                                                                                                               PATIENT EDUCATION:  Education details: POC; HEP  Person educated: Patient Education method: Explanation, Demonstration, Tactile cues, Verbal cues, and Handouts Education comprehension: verbalized understanding, returned demonstration, verbal cues required, tactile cues required, and needs further education  HOME EXERCISE PROGRAM:  Access Code: [email or web address] URL: [email or web address] Date: 08/25/2022 Prepared by: Corlis Leak  Exercises - Supine Transversus Abdominis Bracing with Pelvic Floor Contraction  - 2 x daily - 7 x weekly - 1 sets - 10 reps - 10sec  hold - Ankle Pumps in Elevation  - 2 x daily - 7 x weekly - 1 sets - 10-20 reps - Ankle Circles in Elevation  - 2 x daily - 7 x weekly - 1 sets - 10-15 reps - Ankle Alphabet in Elevation  - 2 x daily - 7 x weekly - 1 sets - 3 reps - Supine Quad Set  - 2 x daily - 7 x weekly - 1-2 sets - 10 reps - 3 sec  hold - Straight Leg Raise with External Rotation  - 1-2 x daily - 7 x weekly - 1-2 sets - 10 reps - 3-5 sec  hold - Sidelying Hip Adduction  - 1-2 x daily - 7 x weekly - 1-2 sets -  10 reps - 3 sec  hold - Prone Hip Extension  - 2 x daily - 7 x weekly - 1 sets - 10 reps - 3 sec  hold - Supine Hip Adduction Isometric with Ball  - 2 x daily - 7 x weekly - 1 sets - 10 reps - 10 sec  hold - Standing Weight Shift Side to Side  - 2 x daily - 7 x weekly - 1 sets - 3 reps - 30 sec  hold - Hooklying Hamstring Stretch with Strap  - 2 x daily - 7 x weekly - 1 sets - 3 reps - 30 sec  hold - Sidelying Hip Adduction  - 2 x daily - 7 x weekly - 1 sets - 3 reps - 30 sec  hold - Wall Quarter Squat  - 2 x daily - 7 x weekly - 1-2 sets - 10 reps - 5-10 sec  hold - Standing Terminal Knee Extension at Wall with Ball  - 2 x daily - 7 x weekly - 1-2 sets - 10 reps - 5-10 sec   hold - Step Up  - 2 x daily - 7 x weekly - 1 sets - 10 reps - 2 sec  hold - Lateral Step Up  - 2 x daily - 7 x weekly - 2 sets - 10 reps - 2 sec  hold - Side Stepping with Resistance at Thighs  - 1 x daily - 7 x weekly  Patient Education - Scar Massage - Kinesotaping   ASSESSMENT:  CLINICAL IMPRESSION: Good gains in AROM Lt knee and improved movement/ exercise tolerance. Continued focus on ROM and muscle activation facilitating medial quad with exercises. Note good medial quad activation with exercises. Patient to continue work on scar massage and desentization for Lt patellar area. Trial of tape to desentize areas of hypersensitivity around knee. Continued weight bearing and wt shift activities standing without brace.   OBJECTIVE IMPAIRMENTS: Patient presents with brace Lt knee; abnormal gait pattern; decreased wt bearing Lt LE; decreased strength and ROM Lt knee; some Rt knee and LB pain possibly from compensatory movement patterns due to Lt LE pain. Abnormal gait, decreased activity tolerance, decreased balance, decreased endurance, decreased mobility, difficulty walking, decreased ROM, decreased strength, increased edema, increased fascial restrictions, impaired flexibility, improper body mechanics, postural dysfunction, and pain.   GOALS: Goals reviewed with patient? Yes  SHORT TERM GOALS: Target date: 09/15/2022  Independent in initial HEP  Baseline: Goal status: INITIAL  2.  Independent gait with least restrictive device for functional distances in home and necessary community distances  Baseline:  Goal status: INITIAL  LONG TERM GOALS: Target date: 10/13/2022  Increase AROM Lt knee to 0 degrees extension and 125 degrees flexion Baseline:  Goal status: INITIAL  2.  Increase strength bilat LE's 4+/5 to 5/5  Baseline:  Goal status: INITIAL  3.  Normal gait pattern for community distances  Baseline:  Goal status: INITIAL  4.  Increase functional activities allowing  patient to return to work and full activities  Baseline:  Goal status: INITIAL  5.  Independent in HEP including aquatic therapy program  Baseline:  Goal status: INITIAL  6.  Improve functional limitation to 61 Baseline:  Goal status: INITIAL   PLAN:  PT FREQUENCY: 2x/week  PT DURATION: 8 weeks  PLANNED INTERVENTIONS: Therapeutic exercises, Therapeutic activity, Neuromuscular re-education, Balance training, Gait training, Patient/Family education, Self Care, Joint mobilization, Stair training, DME instructions, Aquatic Therapy, Dry Needling, Electrical stimulation, Cryotherapy, Moist heat,  Taping, Vasopneumatic device, Ultrasound, Ionotophoresis 4mg /ml Dexamethasone, Manual therapy, and Re-evaluation  PLAN FOR NEXT SESSION: review and progress HEP; manual work, DN, modalities as indicated Will benefit from trial of DN lateral quad and maye hamstrings to release lateral tightness Lt LE    [place], PT 08/25/2022, 12:42 PM

## 2022-08-27 ENCOUNTER — Ambulatory Visit: Payer: 59

## 2022-08-27 ENCOUNTER — Other Ambulatory Visit (HOSPITAL_COMMUNITY): Payer: Self-pay

## 2022-08-31 ENCOUNTER — Ambulatory Visit: Payer: 59 | Attending: Orthopaedic Surgery

## 2022-08-31 DIAGNOSIS — M2241 Chondromalacia patellae, right knee: Secondary | ICD-10-CM | POA: Insufficient documentation

## 2022-08-31 DIAGNOSIS — M25562 Pain in left knee: Secondary | ICD-10-CM | POA: Diagnosis not present

## 2022-08-31 DIAGNOSIS — R2689 Other abnormalities of gait and mobility: Secondary | ICD-10-CM | POA: Diagnosis not present

## 2022-08-31 DIAGNOSIS — M6281 Muscle weakness (generalized): Secondary | ICD-10-CM | POA: Insufficient documentation

## 2022-08-31 DIAGNOSIS — G8929 Other chronic pain: Secondary | ICD-10-CM | POA: Diagnosis not present

## 2022-08-31 NOTE — Therapy (Signed)
OUTPATIENT PHYSICAL THERAPY LOWER EXTREMITY TREATMENT   Patient Name: Heidi Mccormick MRN: [id number] DOB:August 05, 2000, 22 y.o., female Today's Date: 08/31/2022  END OF SESSION:  PT End of Session - 08/31/22 1148     Visit Number 4    Number of Visits 16    Date for PT Re-Evaluation 10/06/22    PT Start Time 1150    PT Stop Time 1233    PT Time Calculation (min) 43 min    Activity Tolerance Patient tolerated treatment well    Behavior During Therapy [facility] for tasks assessed/performed             Past Medical History:  Diagnosis Date   Abnormal vaginal bleeding    Dr. Stann Mainland   Acid reflux    Acne vulgaris    Headache syndrome    summer 2022   History of community acquired pneumonia 05/2021   Menometrorrhagia    Started Yaz 01/30/19   Patellofemoral arthritis    bilat 2023   Vasovagal syncope    summer 2022   Past Surgical History:  Procedure Laterality Date   KNEE ARTHROSCOPY WITH LATERAL RELEASE Left 08/05/2022   Procedure: LEFT KNEE ARTHROSCOPY, LATERAL RELEASE, MICROFRACTURE;  Surgeon: Leandrew Koyanagi, MD;  Location: [facility];  Service: Orthopedics;  Laterality: Left;   NO PAST SURGERIES     Patient Active Problem List   Diagnosis Date Noted   Polyarthralgia 07/17/2022   Neck and shoulder pain, left 05/15/2022   Chronic low back pain 05/15/2022   Patellofemoral arthritis 11/10/2021   Migraine 05/29/2021    PCP: Dr Tammi Sou  REFERRING PROVIDER: Dr Frankey Shown  REFERRING DIAG: Lt patellofemoral arthritis   THERAPY DIAG:  Muscle weakness (generalized)  Chronic pain of left knee  Chondromalacia of right patellofemoral joint  Other abnormalities of gait and mobility  Rationale for Evaluation and Treatment: Rehabilitation  ONSET DATE: 08/05/22  SUBJECTIVE:   SUBJECTIVE STATEMENT: Patient reports she continues to have mild pain when ascending/descending stairs. Patient states she continues to have discomfort with knee brace but she is  wearing it. Patient states she has had no burning in knee last few days.   PERTINENT HISTORY: Long history of Lt > Rt knee pain; LBP for 2 years; pain and problems in the Lt knee for the past 10 years with symptoms increasing in the past 2 years, especially in the past 8-10 months. She underwent elective Lt knee surgery 08/05/22 knee arthroscopy, lateral release, microfracture.  PAIN:  Are you having pain? Yes: NPRS scale: 0/10 Pain location: Lt knee Lt side of knee  Pain description: throbbing Aggravating factors: brace; sitting; lying down  Relieving factors: ice; OTC meds   PRECAUTIONS: Other: post Lt knee sx 08/05/22 - no knee flexion > 90 degrees; in brace for gait   WEIGHT BEARING RESTRICTIONS: No  FALLS:  Has patient fallen in last 6 months? 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 Degenerative changes of the posterior horn of the medial meniscus without discrete tear. 2. Mild patellofemoral arthritic changes with focal full-thickness cartilage defect at the patellar  apex. 3. Cruciate and collateral ligaments are intact. Quadriceps tendon and patellar tendon are intact. No evidence of fracture or osteonecrosis. Rt knee: IMPRESSION: 1.  Mild arthritic changes of the patellofemoral compartment. 2. Cruciate and collateral ligaments are intact. Medial and lateral menisci are intact. Quadriceps tendon and patellar tendon are intact. No evidence of fracture or osteonecrosis. 3.  Small benign osteochondroma on the medial aspect of the tibia.   PATIENT SURVEYS:  FOTO 30 goal  22  COGNITION: Overall cognitive status: Within functional limits for tasks assessed     SENSATION: Numbness in the lateral knee and incisional area   EDEMA:  Circumferential: 1 cm larger Lt LE compared to Rt   MUSCLE LENGTH: Hamstrings: Right 90 deg; Left 65 deg  POSTURE: rounded shoulders, forward head, flexed trunk , and weight shift right  PALPATION: Tightness and tenderness peripatellar Lt knee laterally > medially with light palpation   LOWER EXTREMITY ROM:  Active ROM Right eval Left eval  Hip flexion    Hip extension    Hip abduction    Hip adduction    Hip internal rotation    Hip external rotation    Knee flexion 148 90 limited by MD order   Knee extension 7 -5 mild pain   Ankle dorsiflexion    Ankle plantarflexion    Ankle inversion    Ankle eversion     (Blank rows = not tested)  LOWER EXTREMITY MMT:  MMT Right eval Left eval  Hip flexion 5 4  Hip extension 5- 4+  Hip abduction 4+ 4  Hip adduction 4+ 4  Hip internal rotation    Hip external rotation    Knee flexion 5 NT  Knee extension 5 NT  Ankle dorsiflexion    Ankle plantarflexion    Ankle inversion    Ankle eversion     (Blank rows = not tested)  GAIT: Distance walked: 40 ft Assistive device utilized: None Level of assistance: Complete Independence Comments: limp Lt LE in wt bearing Lt LE - longer step Lt compared to Rt  - Lt LE in brace therefore decreased mobility, limited flexion and extension    TODAY'S TREATMENT:    [facility] Adult PT Treatment:                                                DATE: 08/31/2022 Therapeutic Exercise: NuStep L6 x 55min Long sitting L quad set 15x5" Seated hip add (yoga block) & abd (gait belt) isometrics 10x5" Small ROM L SLR with ER 2x10 S/L straight leg L hip add --> pulses --> circles Supine hip add isometric ball squeeze  Bridges w/ball squeeze 3x5 Leg press: L SL 40# x5  Manual Therapy: kinesotaping to areas of burning medial L knee (50%  tension) & patellar correction kinesiotaping L lateral knee [facility] Adult PT Treatment:      DATE: 08/25/22  Therapeutic Exercise: Nustep L6 x 6 min for warm up and ROM  4 part core 10 sec x 10 supine(w/all exercises)  Quad set 5 sec x 10 pillow under knee  SLR w/ LE in ER 3 sec x 10  SAQ 10 sec x 10   Rt sidelying Lt hip adduction 5 sec x 10  Hamstring stretch supine with strap 30 sec x 1  Lt Hip adduction Lt sidelying  Adductor  squeeze w/ball 10 sec x 10 hooklying Prone glut set with hip extension sec x 10  Standing w/out brace for equal wt bearing, wt shifting side to side and stagger step each foot fwd 1-2 min each  Mini wall squat 10 sec x 10 ball btn knee Side steps red TB above knees x 10 ft x 8  Step up Lt 4 inch x 10 x 2  Lateral step up ~ 1 inch to tap heel x 10 x 2 (challenging - modified height on step with block) Manual Therapy: Trial of kinesotaping to areas of burning Lt lateral and medial knee.  HEP includes: Scar massage and release work lateral quad LE supported on pillow working on deep tissue release to pt tolerance at superior lateral patella at insertion of lateral quad  Neuromuscular re-ed: Working on recruitment of medial quad in supported knee extension with visible and palpable activation of medial quad - pt to continue for home  Therapeutic Activity: HEP - Desentization Lt patellar area/distal thigh Gait: Gait w/ even steps slowing speed to work on wt shift x 2 laps in gym Modalities: To ice at home Self Care: Scar massage; myofacial release lateral quad    PATIENT EDUCATION:  Education details: POC; HEP  Person educated: Patient Education method: Explanation, Demonstration, Tactile cues, Verbal cues, and Handouts Education comprehension: verbalized understanding, returned demonstration, verbal cues required, tactile cues required, and needs further education  HOME EXERCISE PROGRAM:  Access Code: [email or web address] URL:  [email or web address] Date: 08/25/2022 Prepared by: Gillermo Murdoch  Exercises - Supine Transversus Abdominis Bracing with Pelvic Floor Contraction  - 2 x daily - 7 x weekly - 1 sets - 10 reps - 10sec  hold - Ankle Pumps in Elevation  - 2 x daily - 7 x weekly - 1 sets - 10-20 reps - Ankle Circles in Elevation  - 2 x daily - 7 x weekly - 1 sets - 10-15 reps - Ankle Alphabet in Elevation  - 2 x daily - 7 x weekly - 1 sets - 3 reps - Supine Quad Set  - 2 x daily - 7 x weekly - 1-2 sets - 10 reps - 3 sec  hold - Straight Leg Raise with External Rotation  - 1-2 x daily - 7 x weekly - 1-2 sets - 10 reps - 3-5 sec  hold - Sidelying Hip Adduction  - 1-2 x daily - 7 x weekly - 1-2 sets - 10 reps - 3 sec  hold - Prone Hip Extension  - 2 x daily - 7 x weekly - 1 sets - 10 reps - 3 sec  hold - Supine Hip Adduction Isometric with Ball  - 2 x daily - 7 x weekly - 1 sets - 10 reps - 10 sec  hold - Standing Weight Shift Side to Side  - 2 x daily - 7 x weekly - 1 sets - 3 reps - 30 sec  hold - Hooklying Hamstring Stretch with Strap  - 2 x daily - 7 x weekly - 1 sets - 3 reps - 30 sec  hold - Sidelying Hip Adduction  - 2 x daily - 7 x weekly - 1 sets - 3 reps - 30 sec  hold - Wall Quarter Squat  - 2 x daily - 7 x weekly - 1-2 sets - 10 reps - 5-10 sec  hold - Standing Terminal Knee Extension at Wall with Ball  - 2 x daily - 7 x weekly - 1-2 sets -  10 reps - 5-10 sec  hold - Step Up  - 2 x daily - 7 x weekly - 1 sets - 10 reps - 2 sec  hold - Lateral Step Up  - 2 x daily - 7 x weekly - 2 sets - 10 reps - 2 sec  hold - Side Stepping with Resistance at Thighs  - 1 x daily - 7 x weekly  Patient Education - Scar Massage - Kinesotaping   ASSESSMENT:  CLINICAL IMPRESSION: Light resistance added at medial knee during standing weight shifting exercises. Progressed L adductor strengthening in side lying. Continued kiseiotaping of medial knee for decreased burning sensation and lateral knee for  patellofemoral alignment correction.    OBJECTIVE IMPAIRMENTS: Patient presents with brace Lt knee; abnormal gait pattern; decreased wt bearing Lt LE; decreased strength and ROM Lt knee; some Rt knee and LB pain possibly from compensatory movement patterns due to Lt LE pain. Abnormal gait, decreased activity tolerance, decreased balance, decreased endurance, decreased mobility, difficulty walking, decreased ROM, decreased strength, increased edema, increased fascial restrictions, impaired flexibility, improper body mechanics, postural dysfunction, and pain.   GOALS: Goals reviewed with patient? Yes  SHORT TERM GOALS: Target date: 09/15/2022  Independent in initial HEP  Baseline: Goal status: INITIAL  2.  Independent gait with least restrictive device for functional distances in home and necessary community distances  Baseline:  Goal status: INITIAL  LONG TERM GOALS: Target date: 10/13/2022  Increase AROM Lt knee to 0 degrees extension and 125 degrees flexion Baseline:  Goal status: INITIAL  2.  Increase strength bilat LE's 4+/5 to 5/5  Baseline:  Goal status: INITIAL  3.  Normal gait pattern for community distances  Baseline:  Goal status: INITIAL  4.  Increase functional activities allowing patient to return to work and full activities  Baseline:  Goal status: INITIAL  5.  Independent in HEP including aquatic therapy program  Baseline:  Goal status: INITIAL  6.  Improve functional limitation to 61 Baseline:  Goal status: INITIAL   PLAN:  PT FREQUENCY: 2x/week  PT DURATION: 8 weeks  PLANNED INTERVENTIONS: Therapeutic exercises, Therapeutic activity, Neuromuscular re-education, Balance training, Gait training, Patient/Family education, Self Care, Joint mobilization, Stair training, DME instructions, Aquatic Therapy, Dry Needling, Electrical stimulation, Cryotherapy, Moist heat, Taping, Vasopneumatic device, Ultrasound, Ionotophoresis 4mg /ml Dexamethasone, Manual  therapy, and Re-evaluation  PLAN FOR NEXT SESSION: review and progress HEP; manual work, DN, modalities as indicated Will benefit from trial of DN lateral quad and maye hamstrings to release lateral tightness Lt LE    , PTA 08/31/2022, 12:34 PM

## 2022-09-02 ENCOUNTER — Ambulatory Visit: Payer: 59 | Admitting: Rehabilitative and Restorative Service Providers"

## 2022-09-02 ENCOUNTER — Encounter: Payer: Self-pay | Admitting: Rehabilitative and Restorative Service Providers"

## 2022-09-02 DIAGNOSIS — R2689 Other abnormalities of gait and mobility: Secondary | ICD-10-CM

## 2022-09-02 DIAGNOSIS — G8929 Other chronic pain: Secondary | ICD-10-CM | POA: Diagnosis not present

## 2022-09-02 DIAGNOSIS — M2241 Chondromalacia patellae, right knee: Secondary | ICD-10-CM

## 2022-09-02 DIAGNOSIS — M6281 Muscle weakness (generalized): Secondary | ICD-10-CM

## 2022-09-02 DIAGNOSIS — M25562 Pain in left knee: Secondary | ICD-10-CM | POA: Diagnosis not present

## 2022-09-02 NOTE — Progress Notes (Unsigned)
   Post-Op Visit Note   Patient: Heidi Mccormick           Date of Birth: 2000/10/15           MRN: [id number] Visit Date: 09/03/2022 PCP: Tammi Sou, MD   Assessment & Plan:  Chief Complaint: No chief complaint on file.  Visit Diagnoses:  1. Patellofemoral arthritis     Plan: ***  Follow-Up Instructions: No follow-ups on file.   Orders:  No orders of the defined types were placed in this encounter.  No orders of the defined types were placed in this encounter.   Imaging: No results found.  PMFS History: Patient Active Problem List   Diagnosis Date Noted   Polyarthralgia 07/17/2022   Neck and shoulder pain, left 05/15/2022   Chronic low back pain 05/15/2022   Patellofemoral arthritis 11/10/2021   Migraine 05/29/2021   Past Medical History:  Diagnosis Date   Abnormal vaginal bleeding    Dr. Stann Mainland   Acid reflux    Acne vulgaris    Headache syndrome    summer 2022   History of community acquired pneumonia 05/2021   Menometrorrhagia    Started Yaz 01/30/19   Patellofemoral arthritis    bilat 2023   Vasovagal syncope    summer 2022    Family History  Problem Relation Age of Onset   Miscarriages / [place] Mother    Diabetes Father    Heart attack Father    Heart disease Father    High Cholesterol Father    High blood pressure Father    Asthma Brother    Diabetes Maternal Grandmother    Heart disease Maternal Grandmother    High Cholesterol Maternal Grandmother    High blood pressure Maternal Grandmother    Miscarriages / Stillbirths Maternal Grandmother    Breast cancer Maternal Grandmother    Cancer Maternal Grandfather    COPD Maternal Grandfather    High Cholesterol Maternal Grandfather    High blood pressure Maternal Grandfather    Kidney disease Maternal Grandfather    Early death Paternal Grandfather    Heart attack Paternal Grandfather    Heart disease Paternal Grandfather     Past Surgical History:  Procedure Laterality Date    KNEE ARTHROSCOPY WITH LATERAL RELEASE Left 08/05/2022   Procedure: LEFT KNEE ARTHROSCOPY, LATERAL RELEASE, MICROFRACTURE;  Surgeon: Leandrew Koyanagi, MD;  Location: [place];  Service: Orthopedics;  Laterality: Left;   NO PAST SURGERIES     Social History   Occupational History    Comment: Warehouse manager  Tobacco Use   Smoking status: Never   Smokeless tobacco: Never  Vaping Use   Vaping Use: Never used  Substance and Sexual Activity   Alcohol use: Never   Drug use: Never   Sexual activity: Never

## 2022-09-02 NOTE — Therapy (Addendum)
OUTPATIENT PHYSICAL THERAPY LOWER EXTREMITY TREATMENT AND DISCHARGE SUMMARY   PHYSICAL THERAPY DISCHARGE SUMMARY  Visits from Start of Care: 5  Current functional level related to goals / functional outcomes: See progress note for discharge status    Remaining deficits: Needs to continue with strengthening    Education / Equipment: HEP    Patient agrees to discharge. Patient goals were partially met. Patient is being discharged due to not returning since the last visit. Heidi Mccormick PT, MPH 10/28/22 9:03 AM  Patient Name: Heidi Mccormick MRN: [id number] DOB:04-Sep-2000, 22 y.o., female Today's Date: 09/02/2022  END OF SESSION:  PT End of Session - 09/02/22 1152     Visit Number 5    Number of Visits 16    Date for PT Re-Evaluation 10/06/22    PT Start Time 1145    PT Stop Time 1230    PT Time Calculation (min) 45 min    Activity Tolerance Patient tolerated treatment well             Past Medical History:  Diagnosis Date   Abnormal vaginal bleeding    Dr. Aldona Bar   Acid reflux    Acne vulgaris    Headache syndrome    summer 2022   History of community acquired pneumonia 05/2021   Menometrorrhagia    Started Yaz 01/30/19   Patellofemoral arthritis    bilat 2023   Vasovagal syncope    summer 2022   Past Surgical History:  Procedure Laterality Date   KNEE ARTHROSCOPY WITH LATERAL RELEASE Left 08/05/2022   Procedure: LEFT KNEE ARTHROSCOPY, LATERAL RELEASE, MICROFRACTURE;  Surgeon: Tarry Kos, MD;  Location: [facility];  Service: Orthopedics;  Laterality: Left;   NO PAST SURGERIES     Patient Active Problem List   Diagnosis Date Noted   Polyarthralgia 07/17/2022   Neck and shoulder pain, left 05/15/2022   Chronic low back pain 05/15/2022   Patellofemoral arthritis 11/10/2021   Migraine 05/29/2021    PCP: Dr Jeoffrey Massed  REFERRING PROVIDER: Dr Gershon Mussel  REFERRING DIAG: Lt patellofemoral arthritis   THERAPY DIAG:  Muscle weakness  (generalized)  Chronic pain of left knee  Chondromalacia of right patellofemoral joint  Other abnormalities of gait and mobility  Rationale for Evaluation and Treatment: Rehabilitation  ONSET DATE: 08/05/22  SUBJECTIVE:   SUBJECTIVE STATEMENT: Patient reports she was able to walk down her steps step over step today for the first tie and she had no pain. He kneecap didn't feel that it was going out. Can tell her knee has made so much progress in the past week - "like 180 turn around"  PERTINENT HISTORY: Long history of Lt > Rt knee pain; LBP for 2 years; pain and problems in the Lt knee for the past 10 years with symptoms increasing in the past 2 years, especially in the past 8-10 months. She underwent elective Lt knee surgery 08/05/22 knee arthroscopy, lateral release, microfracture.  PAIN:  Are you having pain? Yes: NPRS scale: 0/10 Pain location: Lt knee Lt side of knee  Pain description:  Aggravating factors: brace; sitting; lying down  Relieving factors: ice; OTC meds   PRECAUTIONS: Other: post Lt knee sx 08/05/22 - no knee flexion > 90 degrees; in brace for gait   WEIGHT BEARING RESTRICTIONS: No  FALLS:  Has patient fallen in last 6 months? 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 Degenerative changes of the posterior horn of the medial meniscus without discrete tear. 2. Mild patellofemoral arthritic changes with focal full-thickness cartilage defect at the patellar apex. 3. Cruciate and collateral ligaments are intact. Quadriceps tendon and patellar tendon are intact. No evidence of fracture or  osteonecrosis. Rt knee: IMPRESSION: 1.  Mild arthritic changes of the patellofemoral compartment. 2. Cruciate and collateral ligaments are intact. Medial and lateral menisci are intact. Quadriceps tendon and patellar tendon are intact. No evidence of fracture or osteonecrosis. 3.  Small benign osteochondroma on the medial aspect of the tibia.   PATIENT SURVEYS:  FOTO 60 goal 72  COGNITION: Overall cognitive status: Within functional limits for tasks assessed     SENSATION: Numbness in the lateral knee and incisional area   EDEMA:  Circumferential: 1 cm larger Lt LE compared to Rt   MUSCLE LENGTH: Hamstrings: Right 90 deg; Left 65 deg  POSTURE: rounded shoulders, forward head, flexed trunk , and weight shift right  PALPATION: Tightness and tenderness peripatellar Lt knee laterally > medially with light palpation   LOWER EXTREMITY ROM:  Active ROM Right eval Left eval  Hip flexion    Hip extension    Hip abduction    Hip adduction    Hip internal rotation    Hip external rotation    Knee flexion 148 90 limited by MD order   Knee extension 7 -5 mild pain   Ankle dorsiflexion    Ankle plantarflexion    Ankle inversion    Ankle eversion     (Blank rows = not tested)  LOWER EXTREMITY MMT:  MMT Right eval Left eval  Hip flexion 5 4  Hip extension 5- 4+  Hip abduction 4+ 4  Hip adduction 4+ 4  Hip internal rotation    Hip external rotation    Knee flexion 5 NT  Knee extension 5 NT  Ankle dorsiflexion    Ankle plantarflexion    Ankle inversion    Ankle eversion     (Blank rows = not tested)  GAIT: Distance walked: 40 ft Assistive device utilized: None Level of assistance: Complete Independence Comments: limp Lt LE in wt bearing Lt LE - longer step Lt compared to Rt  - Lt LE in brace therefore decreased mobility, limited flexion and extension    TODAY'S TREATMENT:    [facility] Adult PT Treatment:                                                 DATE:  09/02/2022 Therapeutic Exercise: Recumbent bike backward for ROM no resistance ~ 2 min Recumbent bike L2 x 33min Slant board gastroc stretch 30 sec x 2  Slant board soleus stretch 30 sec x 2  Terminal knee extension 5 sec x 10 x 2 Standing clock reach Lt/Rt x 10 each Standing slider Lt/Rt x 10 each  Backward step up and step down Lt 4 inch step x 10 x 3  Step up Lt fwd 6 inch step 10 x 2  Lateral heel tap, tapping Rt heel (dropping  hip - held)  Sidelying hip abd recruiting glut med 3 sec 10 x 2  Leg press: L SL 40# x5  Manual Therapy: kinesotaping to areas of burning medial L knee (50% tension) & patellar correction kinesiotaping to correct lateral patellar position   DATE: 08/31/2022 Therapeutic Exercise: NuStep L6 x 12min Long sitting L quad set 15x5" Seated hip add (yoga block) & abd (gait belt) isometrics 10x5" Small ROM L SLR with ER 2x10 S/L straight leg L hip add --> pulses --> circles Supine hip add isometric ball squeeze  Sidelying hip abduction rolling forward to recruit glut med Rt/Lt x 10 x 2  Leg press: bilat SL 65# x 10 x 2; Lt only 45# x 5   Manual Therapy: kinesotaping to areas of burning medial L knee (50% tension) & patellar correction kinesiotaping L lateral knee [facility] Adult PT Treatment:      DATE: 08/25/22  Therapeutic Exercise: Nustep L6 x 6 min for warm up and ROM  4 part core 10 sec x 10 supine(w/all exercises)  Quad set 5 sec x 10 pillow under knee  SLR w/ LE in ER 3 sec x 10  SAQ 10 sec x 10   Rt sidelying Lt hip adduction 5 sec x 10  Hamstring stretch supine with strap 30 sec x 1  Lt Hip adduction Lt sidelying  Adductor squeeze w/ball 10 sec x 10 hooklying Prone glut set with hip extension sec x 10  Standing w/out brace for equal wt bearing, wt shifting side to side and stagger step each foot fwd 1-2 min each  Mini wall squat 10 sec x 10 ball btn knee Side steps red TB above knees x 10 ft x 8  Step up Lt 4 inch x 10 x 2  Lateral step up ~ 1 inch to  tap heel x 10 x 2 (challenging - modified height on step with block) Manual Therapy: Trial of kinesotaping to areas of burning Lt lateral and medial knee.  HEP includes: Scar massage and release work lateral quad LE supported on pillow working on deep tissue release to pt tolerance at superior lateral patella at insertion of lateral quad  Neuromuscular re-ed: Working on recruitment of medial quad in supported knee extension with visible and palpable activation of medial quad - pt to continue for home  Therapeutic Activity: HEP - Desentization Lt patellar area/distal thigh Gait: Gait w/ even steps slowing speed to work on wt shift x 2 laps in gym Modalities: To ice at home Self Care: Scar massage; myofacial release lateral quad    PATIENT EDUCATION:  Education details: POC; HEP  Person educated: Patient Education method: Explanation, Demonstration, Tactile cues, Verbal cues, and Handouts Education comprehension: verbalized understanding, returned demonstration, verbal cues required, tactile cues required, and needs further education  HOME EXERCISE PROGRAM: Access Code: [email or web address] URL: [email or web address] Date: 09/02/2022 Prepared by: Gillermo Murdoch  Exercises - Supine Transversus Abdominis Bracing with Pelvic Floor Contraction  - 2 x daily - 7 x weekly - 1 sets - 10 reps - 10sec  hold - Ankle Pumps in Elevation  - 2 x daily - 7 x weekly - 1 sets - 10-20 reps - Ankle Circles in Elevation  - 2 x daily - 7 x weekly - 1 sets - 10-15 reps - Ankle Alphabet in Elevation  - 2 x daily - 7 x weekly - 1 sets - 3 reps - Supine Quad Set  - 2 x daily - 7 x weekly -  1-2 sets - 10 reps - 3 sec  hold - Straight Leg Raise with External Rotation  - 1-2 x daily - 7 x weekly - 1-2 sets - 10 reps - 3-5 sec  hold - Sidelying Hip Adduction  - 1-2 x daily - 7 x weekly - 1-2 sets - 10 reps - 3 sec  hold - Prone Hip Extension  - 2 x daily - 7 x weekly - 1 sets - 10 reps - 3 sec  hold - Supine Hip  Adduction Isometric with Ball  - 2 x daily - 7 x weekly - 1 sets - 10 reps - 10 sec  hold - Standing Weight Shift Side to Side  - 2 x daily - 7 x weekly - 1 sets - 3 reps - 30 sec  hold - Hooklying Hamstring Stretch with Strap  - 2 x daily - 7 x weekly - 1 sets - 3 reps - 30 sec  hold - Sidelying Hip Adduction  - 2 x daily - 7 x weekly - 1 sets - 3 reps - 30 sec  hold - Wall Quarter Squat  - 2 x daily - 7 x weekly - 1-2 sets - 10 reps - 5-10 sec  hold - Standing Terminal Knee Extension at Wall with Ball  - 2 x daily - 7 x weekly - 1-2 sets - 10 reps - 5-10 sec  hold - Step Up  - 2 x daily - 7 x weekly - 1 sets - 10 reps - 2 sec  hold - Lateral Step Up  - 2 x daily - 7 x weekly - 2 sets - 10 reps - 2 sec  hold - Side Stepping with Resistance at Thighs  - 1 x daily - 7 x weekly - Gastroc Stretch on Wall  - 2 x daily - 7 x weekly - 1 sets - 3 reps - 30 sec  hold - Soleus Stretch on Wall  - 2 x daily - 7 x weekly - 1 sets - 3 reps - 30 sec  hold - Standing Terminal Knee Extension at Wall with Ball  - 2 x daily - 7 x weekly - 1-2 sets - 10 reps - 5-10 sec  hold - Forward Step Down  - 2 x daily - 7 x weekly - 1-3 sets - 10 reps - 2 sec  hold - The Diver  - 2 x daily - 7 x weekly - 1 sets - 10 reps - 2-3 sec  hold - Single Leg Balance with Clock Reach  - 1 x daily - 7 x weekly - 1-2 sets - 10 reps - Sidelying Hip Abduction  - 1 x daily - 7 x weekly - 3 sets - 10 reps - 3-5 sec  hold  POOL  - Standing Hip Abduction Adduction at Pool Wall  - 1 x daily - 1 x weekly - 1 sets - 10 reps - Standing Knee Flexion  - 1 x daily - 7 x weekly - 3 sets - 10 reps - Standing Hip Flexion Extension at [employer]  - 1 x daily - 1 x weekly - 1 sets - 10 reps - Forward Walking  - 1 x daily - 1 x weekly - 1 sets - 10 reps - Backward Walking  - 1 x daily - 1 x weekly - 1 sets - 10 reps - Lateral Stepping at Pool Wall  - 2 x daily - 7 x weekly - 1 sets -  3 reps - 30 sec  hold - Forward March  - 1 x daily - 1 x weekly - 1  sets - 10 reps - Alternating Forward Lunge  - 1 x daily - 1 x weekly - 1 sets - 10 reps - Jumping Jacks  - 1 x daily - 1 x weekly - 1 sets - 10 reps - High Knee Balance with Forward Walking  - 1 x daily - 1 x weekly - 1 sets - 10 reps - Carioca with Hand Floats  - 1 x daily - 1 x weekly - 1 sets - 10 reps - Standing Single Leg Hip Circles with Hand Floats  - 1 x daily - 1 x weekly - 1 sets - 10 reps  Patient Education - Scar Massage - Kinesotaping   ASSESSMENT:  CLINICAL IMPRESSION: Excellent progress in the past week with decreased pain and increased strength and improving functional abilities. Added higher level exercises focus on standing exercises today. Continued kiseiotaping of medial knee for decreased burning sensation and lateral knee for patellofemoral alignment correction. Instructed in beginning water exercise program for pt to start before she can get in to aquatic therapy.    OBJECTIVE IMPAIRMENTS: Patient presents with brace Lt knee; abnormal gait pattern; decreased wt bearing Lt LE; decreased strength and ROM Lt knee; some Rt knee and LB pain possibly from compensatory movement patterns due to Lt LE pain. Abnormal gait, decreased activity tolerance, decreased balance, decreased endurance, decreased mobility, difficulty walking, decreased ROM, decreased strength, increased edema, increased fascial restrictions, impaired flexibility, improper body mechanics, postural dysfunction, and pain.   GOALS: Goals reviewed with patient? Yes  SHORT TERM GOALS: Target date: 09/15/2022  Independent in initial HEP  Baseline: Goal status: INITIAL  2.  Independent gait with least restrictive device for functional distances in home and necessary community distances  Baseline:  Goal status: INITIAL  LONG TERM GOALS: Target date: 10/13/2022  Increase AROM Lt knee to 0 degrees extension and 125 degrees flexion Baseline:  Goal status: INITIAL  2.  Increase strength bilat LE's 4+/5 to 5/5   Baseline:  Goal status: INITIAL  3.  Normal gait pattern for community distances  Baseline:  Goal status: INITIAL  4.  Increase functional activities allowing patient to return to work and full activities  Baseline:  Goal status: INITIAL  5.  Independent in HEP including aquatic therapy program  Baseline:  Goal status: INITIAL  6.  Improve functional limitation to 61 Baseline:  Goal status: INITIAL   PLAN:  PT FREQUENCY: 2x/week  PT DURATION: 8 weeks  PLANNED INTERVENTIONS: Therapeutic exercises, Therapeutic activity, Neuromuscular re-education, Balance training, Gait training, Patient/Family education, Self Care, Joint mobilization, Stair training, DME instructions, Aquatic Therapy, Dry Needling, Electrical stimulation, Cryotherapy, Moist heat, Taping, Vasopneumatic device, Ultrasound, Ionotophoresis 4mg /ml Dexamethasone, Manual therapy, and Re-evaluation  PLAN FOR NEXT SESSION: review and progress HEP; manual work, DN, modalities as indicated Will benefit from trial of DN lateral quad and maye hamstrings to release lateral tightness Lt LE    Leisel Pinette P Symphony Demuro, PT 09/02/2022, 11:52 AM

## 2022-09-03 ENCOUNTER — Encounter: Payer: Self-pay | Admitting: Orthopaedic Surgery

## 2022-09-03 ENCOUNTER — Ambulatory Visit (INDEPENDENT_AMBULATORY_CARE_PROVIDER_SITE_OTHER): Payer: 59 | Admitting: Orthopaedic Surgery

## 2022-09-03 DIAGNOSIS — M1710 Unilateral primary osteoarthritis, unspecified knee: Secondary | ICD-10-CM

## 2022-09-07 ENCOUNTER — Ambulatory Visit: Payer: 59 | Admitting: Rehabilitative and Restorative Service Providers"

## 2022-09-17 ENCOUNTER — Ambulatory Visit (HOSPITAL_BASED_OUTPATIENT_CLINIC_OR_DEPARTMENT_OTHER): Payer: 59 | Admitting: Physical Therapy

## 2022-09-24 ENCOUNTER — Ambulatory Visit (HOSPITAL_BASED_OUTPATIENT_CLINIC_OR_DEPARTMENT_OTHER): Payer: 59 | Admitting: Physical Therapy

## 2022-09-25 ENCOUNTER — Ambulatory Visit (HOSPITAL_BASED_OUTPATIENT_CLINIC_OR_DEPARTMENT_OTHER): Payer: 59 | Admitting: Physical Therapy

## 2022-10-01 ENCOUNTER — Ambulatory Visit (HOSPITAL_BASED_OUTPATIENT_CLINIC_OR_DEPARTMENT_OTHER): Payer: 59 | Admitting: Physical Therapy

## 2022-10-02 ENCOUNTER — Ambulatory Visit (HOSPITAL_BASED_OUTPATIENT_CLINIC_OR_DEPARTMENT_OTHER): Payer: 59 | Admitting: Physical Therapy

## 2022-10-08 ENCOUNTER — Ambulatory Visit (HOSPITAL_BASED_OUTPATIENT_CLINIC_OR_DEPARTMENT_OTHER): Payer: 59 | Admitting: Physical Therapy

## 2022-10-09 ENCOUNTER — Ambulatory Visit (HOSPITAL_BASED_OUTPATIENT_CLINIC_OR_DEPARTMENT_OTHER): Payer: 59 | Admitting: Physical Therapy

## 2022-10-14 ENCOUNTER — Encounter: Payer: 59 | Admitting: Rehabilitative and Restorative Service Providers"

## 2022-10-15 ENCOUNTER — Ambulatory Visit (HOSPITAL_BASED_OUTPATIENT_CLINIC_OR_DEPARTMENT_OTHER): Payer: 59 | Admitting: Physical Therapy

## 2022-10-15 ENCOUNTER — Encounter: Payer: Self-pay | Admitting: Orthopaedic Surgery

## 2022-10-15 ENCOUNTER — Ambulatory Visit (INDEPENDENT_AMBULATORY_CARE_PROVIDER_SITE_OTHER): Payer: 59 | Admitting: Physician Assistant

## 2022-10-15 DIAGNOSIS — Z9889 Other specified postprocedural states: Secondary | ICD-10-CM

## 2022-10-15 NOTE — Progress Notes (Signed)
   Post-Op Visit Note   Patient: Heidi Mccormick           Date of Birth: April 19, 2001           MRN: [id number] Visit Date: 10/15/2022 PCP: Tammi Sou, MD   Assessment & Plan:  Chief Complaint:  Chief Complaint  Patient presents with   Left Knee - Follow-up    Left knee scope 08/05/2022   Visit Diagnoses:  1. S/P left knee arthroscopy     Plan: Patient is a pleasant 22 year old female who comes in today 10 weeks status post left knee lateral release and abrasion arthroplasty of the patella, date of surgery 08/05/2022.  She has been doing well.  She notes occasional stiffness with stair climbing in addition to slight decrease sensation to the medial knee.  Otherwise, no complaints.  Examination of the left knee reveals full and painless range of motion.  At this point, she will continue to advance with activity as tolerated.  Follow-up as needed.  Follow-Up Instructions: Return if symptoms worsen or fail to improve.   Orders:  No orders of the defined types were placed in this encounter.  No orders of the defined types were placed in this encounter.   Imaging: No new imaging  PMFS History: Patient Active Problem List   Diagnosis Date Noted   Polyarthralgia 07/17/2022   Neck and shoulder pain, left 05/15/2022   Chronic low back pain 05/15/2022   Patellofemoral arthritis 11/10/2021   Migraine 05/29/2021   Past Medical History:  Diagnosis Date   Abnormal vaginal bleeding    Dr. Stann Mainland   Acid reflux    Acne vulgaris    Headache syndrome    summer 2022   History of community acquired pneumonia 05/2021   Menometrorrhagia    Started Yaz 01/30/19   Patellofemoral arthritis    bilat 2023   Vasovagal syncope    summer 2022    Family History  Problem Relation Age of Onset   Miscarriages / [place] Mother    Diabetes Father    Heart attack Father    Heart disease Father    High Cholesterol Father    High blood pressure Father    Asthma Brother    Diabetes  Maternal Grandmother    Heart disease Maternal Grandmother    High Cholesterol Maternal Grandmother    High blood pressure Maternal Grandmother    Miscarriages / Stillbirths Maternal Grandmother    Breast cancer Maternal Grandmother    Cancer Maternal Grandfather    COPD Maternal Grandfather    High Cholesterol Maternal Grandfather    High blood pressure Maternal Grandfather    Kidney disease Maternal Grandfather    Early death Paternal Grandfather    Heart attack Paternal Grandfather    Heart disease Paternal Grandfather     Past Surgical History:  Procedure Laterality Date   KNEE ARTHROSCOPY WITH LATERAL RELEASE Left 08/05/2022   Procedure: LEFT KNEE ARTHROSCOPY, LATERAL RELEASE, MICROFRACTURE;  Surgeon: Leandrew Koyanagi, MD;  Location: [facility];  Service: Orthopedics;  Laterality: Left;   NO PAST SURGERIES     Social History   Occupational History    Comment: Warehouse manager  Tobacco Use   Smoking status: Never   Smokeless tobacco: Never  Vaping Use   Vaping Use: Never used  Substance and Sexual Activity   Alcohol use: Never   Drug use: Never   Sexual activity: Never

## 2022-10-16 ENCOUNTER — Ambulatory Visit (HOSPITAL_BASED_OUTPATIENT_CLINIC_OR_DEPARTMENT_OTHER): Payer: 59 | Admitting: Physical Therapy

## 2022-12-23 ENCOUNTER — Encounter: Payer: Self-pay | Admitting: Family Medicine

## 2022-12-23 NOTE — Telephone Encounter (Signed)
Sorry, but I must see this in person.

## 2023-01-06 ENCOUNTER — Other Ambulatory Visit (HOSPITAL_COMMUNITY): Payer: Self-pay

## 2023-01-06 ENCOUNTER — Telehealth: Payer: 59 | Admitting: Nurse Practitioner

## 2023-01-06 DIAGNOSIS — L708 Other acne: Secondary | ICD-10-CM | POA: Diagnosis not present

## 2023-01-06 MED ORDER — DOXYCYCLINE HYCLATE 100 MG PO TABS
ORAL_TABLET | ORAL | 0 refills | Status: AC
  Filled 2023-01-06: qty 37, 30d supply, fill #0

## 2023-01-06 MED ORDER — BENZOYL PEROXIDE-ERYTHROMYCIN 5-3 % EX GEL
Freq: Two times a day (BID) | CUTANEOUS | 0 refills | Status: DC
  Filled 2023-01-06: qty 23.3, 20d supply, fill #0

## 2023-01-06 NOTE — Progress Notes (Signed)
E-Visit for Acne  We are sorry that you are experiencing this issue.  Here is how we plan to help!  Based on what you shared with me it looks like you have cystic acne.  Acne is a disorder of the hair follicles and oil glands (sebaceous glands). The sebaceous glands secrete oils to keep the skin moist.  When the glands get clogged, it can lead to pimples or cysts.  These cysts may become infected and leave scars. Acne is very common and normally occurs at puberty.  Acne is also inherited.  Your personal care plan consists of the following recommendations:  I recommend that you use a daily cleanser  You may try a topical exfoliator and salicylic acid scrub.  These scrubs have coarse particles that clear your pores but may also irritate your skin.  I have prescribed a topical gel with an antibiotic:  Benzoyl peroxide-erythromycin gel.  This gel should be applied to the affected areas twice a day. Be sure to read the package insert for potential side effects.  I have also prescribed one of the following additional therapies:  Doxycycline an oral antibiotic 100 mg twice a day for one week and then once weekly for 3 weeks.  If you feel that you need a longer course you will ned to follow up with your primary care or a dermatologist   This medication can make you more sensitive to the sun use extra protection when outdoors  This medication should not be taken if you plan to become pregnant   If excessive dryness or peeling occurs, reduce dose frequency or concentration of the topical scrubs.  If excessive stinging or burning occurs, remove the topical gel with mild soap and water and resume at a lower dose the next day.  Remember oral antibiotics and topical acne treatments may increase your sensitivity to the sun!  HOME CARE: Do not squeeze pimples because that can often lead to infections, worse acne, and scars. Use a moisturizer that contains retinoid or fruit acids that may inhibit the  development of new acne lesions. Although there is not a clear link that foods can cause acne, doctors do believe that too many sweets predispose you to skin problems.  GET HELP RIGHT AWAY IF: If your acne gets worse or is not better within 10 days. If you become depressed. If you become pregnant, discontinue medications and call your OB/GYN.  MAKE SURE YOU: Understand these instructions. Will watch your condition. Will get help right away if you are not doing well or get worse.  Thank you for choosing an e-visit.  Your e-visit answers were reviewed by a board certified advanced clinical practitioner to complete your personal care plan. Depending upon the condition, your plan could have included both over the counter or prescription medications.  Please review your pharmacy choice. Make sure the pharmacy is open so you can pick up prescription now. If there is a problem, you may contact your provider through [employer] and have the prescription routed to another pharmacy.  Your safety is important to [place]. If you have drug allergies check your prescription carefully.   For the next 24 hours you can use MyChart to ask questions about today's visit, request a non-urgent call back, or ask for a work or school excuse. You will get an email in the next two days asking about your experience. I hope that your e-visit has been valuable and will speed your recovery.  Meds ordered this encounter  Medications   benzoyl peroxide-erythromycin (BENZAMYCIN) gel    Sig: Apply topically 2 (two) times daily.    Dispense:  23.3 g    Refill:  0   doxycycline (VIBRA-TABS) 100 MG tablet    Sig: Take twice daily for one week then decrease to once daily for 3 weeks. Take with food    Dispense:  37 tablet    Refill:  0    I spent approximately 5 minutes reviewing the patient's history, current symptoms and coordinating their care today.

## 2023-01-07 ENCOUNTER — Ambulatory Visit (INDEPENDENT_AMBULATORY_CARE_PROVIDER_SITE_OTHER): Payer: 59 | Admitting: Family Medicine

## 2023-01-07 ENCOUNTER — Other Ambulatory Visit (HOSPITAL_COMMUNITY): Payer: Self-pay

## 2023-01-07 VITALS — BP 145/83 | HR 110 | Ht 67.0 in | Wt 154.4 lb

## 2023-01-07 DIAGNOSIS — G90A Postural orthostatic tachycardia syndrome (POTS): Secondary | ICD-10-CM | POA: Diagnosis not present

## 2023-01-07 DIAGNOSIS — R000 Tachycardia, unspecified: Secondary | ICD-10-CM

## 2023-01-07 DIAGNOSIS — R600 Localized edema: Secondary | ICD-10-CM

## 2023-01-07 NOTE — Progress Notes (Signed)
OFFICE VISIT  01/07/2023  CC:  Chief Complaint  Patient presents with   Swelling    Pt c/o swelling in her lower legs/ankle region for over 3 months, got off birth control 4 months ago. Prescribed Doxycycline yesterday but would like to discuss with provider first.   Patient is a 22 y.o. female who presents for legs swelling.  HPI: Legs swelling from mid tibia level down over ankles and feet, at least x 1 yr but seems worse after she stopped OCP a few months ago.  Gets better if elevates legs. She does not add salt to food buts states that "I eat too much".  Exercises at [facility] 3 d/week. Also feels heart beating fast most of the time, worse when sits up/stands up and worse with exerise (gets up to 170 with just walking on treadmill.  At rest if often goes over 130). Every morning when she gets out of bed she gets very dizzy and nauseated.  Often similar feeling after standing up. No headaches, no chest pain, no SOB.  Menses regular the last few cycles --off OCP.  ROS as above, plus-->question of cold intolerance.  no fevers, no wheezing, no cough, no rashes, no melena/hematochezia.  No polyuria or polydipsia.  No myalgias or arthralgias.  No focal weakness, paresthesias, or tremors.  No acute vision or hearing abnormalities.  No dysuria or unusual/new urinary urgency or frequency.   No n/v/d or abd pain.    Past Medical History:  Diagnosis Date   Abnormal vaginal bleeding    Dr. Aldona Bar   Acid reflux    Acne vulgaris    Headache syndrome    summer 2022   History of community acquired pneumonia 05/2021   Menometrorrhagia    Started Yaz 01/30/19   Patellofemoral arthritis    bilat 2023   Vasovagal syncope    summer 2022    Past Surgical History:  Procedure Laterality Date   KNEE ARTHROSCOPY WITH LATERAL RELEASE Left 08/05/2022   Procedure: LEFT KNEE ARTHROSCOPY, LATERAL RELEASE, MICROFRACTURE;  Surgeon: Tarry Kos, MD;  Location: [facility];  Service: Orthopedics;   Laterality: Left;   NO PAST SURGERIES      Outpatient Medications Prior to Visit  Medication Sig Dispense Refill   benzoyl peroxide-erythromycin (BENZAMYCIN) gel Apply topically 2 (two) times daily. 23.3 g 0   pantoprazole (PROTONIX) 40 MG tablet Take 1 tablet (40 mg total) by mouth daily. 30 tablet 6   doxycycline (VIBRA-TABS) 100 MG tablet Take 1 tablet (100 mg total) by mouth 2 (two) times daily for 7 days, THEN 1 tablet (100 mg total) daily thereafter. take with food (Patient not taking: Reported on 01/07/2023) 37 tablet 0   ibuprofen (ADVIL) 800 MG tablet Take 1 tablet (800 mg total) by mouth every 8 (eight) hours as needed. 30 tablet 2   No facility-administered medications prior to visit.    No Known Allergies  Review of Systems  As per HPI  PE:    01/07/2023    3:27 PM 08/05/2022    4:15 PM 08/05/2022    3:45 PM  Vitals with BMI  Height 5\' 7"     Weight 154 lbs 6 oz    BMI 24.18    Systolic 145 117 161  Diastolic 83 80 80  Pulse 110 83 83   Orthostatics: Supine blood pressure 142/84, heart rate 104. Upright blood pressure 138/78, heart rate 101. Standing blood pressure 132/80, heart rate 118  Physical Exam  Gen:  Alert, well appearing.  Patient is oriented to person, place, time, and situation. AFFECT: pleasant but anxious, lucid thought and speech CV: regular, tachy to 110-115 sitting upright, no murmur LUNGS: CTA bilat, non-labored EXT: no pitting edema.  Question subtle nonpitting edema around ankles bilat. No erythema, varicosities, asymmetry, or tenderness.  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 Will check metabolic panel, CBC, TSH, and magnesium. Refer to electrophysiology.  #2 lower extremity edema. Suspect this is compensatory , secondary to #1 above. Discussed lowering sodium intake more aggressively.  She has compression stockings that she uses already.  An After Visit Summary was printed and given to the patient.  FOLLOW UP: Return in about 1 month (around 02/06/2023) for tachycardia.  Signed:  Santiago Bumpers, MD           01/07/2023

## 2023-01-08 LAB — COMPREHENSIVE METABOLIC PANEL
ALT: 31 U/L (ref 0–35)
AST: 28 U/L (ref 0–37)
Albumin: 4.5 g/dL (ref 3.5–5.2)
Alkaline Phosphatase: 77 U/L (ref 39–117)
BUN: 11 mg/dL (ref 6–23)
CO2: 25 mEq/L (ref 19–32)
Calcium: 9.7 mg/dL (ref 8.4–10.5)
Chloride: 102 mEq/L (ref 96–112)
Creatinine, Ser: 0.74 mg/dL (ref 0.40–1.20)
GFR: 113.62 mL/min (ref >60.00)
Glucose, Bld: 92 mg/dL (ref 70–99)
Potassium: 3.9 mEq/L (ref 3.5–5.1)
Sodium: 137 mEq/L (ref 135–145)
Total Bilirubin: 0.5 mg/dL (ref 0.2–1.2)
Total Protein: 7.7 g/dL (ref 6.0–8.3)

## 2023-01-08 LAB — TSH: TSH: 2.03 u[IU]/mL (ref 0.35–5.50)

## 2023-01-08 LAB — CBC
HCT: 42.6 % (ref 36.0–46.0)
Hemoglobin: 14.1 g/dL (ref 12.0–15.0)
MCHC: 33.2 g/dL (ref 30.0–36.0)
MCV: 90.4 fl (ref 78.0–100.0)
Platelets: 285 10*3/uL (ref 150.0–400.0)
RBC: 4.71 Mil/uL (ref 3.87–5.11)
RDW: 13.4 % (ref 11.5–15.5)
WBC: 9.5 10*3/uL (ref 4.0–10.5)

## 2023-01-08 LAB — MAGNESIUM: Magnesium: 1.8 mg/dL (ref 1.5–2.5)

## 2023-01-21 ENCOUNTER — Other Ambulatory Visit (HOSPITAL_COMMUNITY): Payer: Self-pay

## 2023-02-04 ENCOUNTER — Other Ambulatory Visit: Payer: Self-pay | Admitting: Oncology

## 2023-02-04 DIAGNOSIS — Z006 Encounter for examination for normal comparison and control in clinical research program: Secondary | ICD-10-CM

## 2023-02-15 ENCOUNTER — Encounter: Payer: 59 | Admitting: Family Medicine

## 2023-02-22 NOTE — Progress Notes (Unsigned)
Cardiology Office Note:    Date:  02/25/2023   ID:  DETTE BRIGNAC, DOB October 24, 2000, MRN [id number]  PCP:  Jeoffrey Massed, MD  Cardiologist:  Little Ishikawa, MD  Electrophysiologist:  None   Referring MD: Jeoffrey Massed, MD   Chief Complaint  Patient presents with   Palpitations    History of Present Illness:    Heidi Mccormick is a 22 y.o. female with a hx of GERD who is referred by Dr. Milinda Cave for evaluation of tachycardia.  She reports has been having episodes where she feels like her heart is racing.  States that she will just be resting and heart will start racing, can last up to 30 seconds.  She feels lightheaded during episodes.  She also reports multiple syncopal episodes, though none recently.  During episode she feels diaphoretic and nauseous and then passes out.  Also reports she has been getting short of breath with minimal exertion, states that walking up stairs or trying to walk on treadmill will feel short of breath and sometimes have chest pain.  Also reports swelling in her legs.  No smoking history.  Family history includes father had multiple MIs, starting in 30s.   Past Medical History:  Diagnosis Date   Abnormal vaginal bleeding    Dr. Aldona Bar   Acid reflux    Acne vulgaris    Headache syndrome    summer 2022   History of community acquired pneumonia 05/2021   Menometrorrhagia    Started Yaz 01/30/19   Patellofemoral arthritis    bilat 2023   Vasovagal syncope    summer 2022    Past Surgical History:  Procedure Laterality Date   KNEE ARTHROSCOPY WITH LATERAL RELEASE Left 08/05/2022   Procedure: LEFT KNEE ARTHROSCOPY, LATERAL RELEASE, MICROFRACTURE;  Surgeon: Tarry Kos, MD;  Location: [facility];  Service: Orthopedics;  Laterality: Left;   NO PAST SURGERIES      Current Medications: Current Meds  Medication Sig   pantoprazole (PROTONIX) 40 MG tablet Take 1 tablet (40 mg total) by mouth daily.     Allergies:   Patient has  no known allergies.   Social History   Socioeconomic History   Marital status: Single    Spouse name: Not on file   Number of children: 0   Years of education: Not on file   Highest education level: 12th grade  Occupational History    Comment: Data processing manager  Tobacco Use   Smoking status: Never   Smokeless tobacco: Never  Vaping Use   Vaping status: Never Used  Substance and Sexual Activity   Alcohol use: Never   Drug use: Never   Sexual activity: Yes    Birth control/protection: Condom  Other Topics Concern   Not on file  Social History Narrative   Single, no children.   Lives at home with parents as of 01/2019.   Educ: HS--[employer].   Occup: Data processing manager   No T/A/Ds   Caffeine- coffee 1 c daily   Social Determinants of Health   Financial Resource Strain: Low Risk  (01/07/2023)   Overall Financial Resource Strain (CARDIA)    Difficulty of Paying Living Expenses: Not very hard  Food Insecurity: No Food Insecurity (01/07/2023)   Hunger Vital Sign    Worried About Running Out of Food in the Last Year: Never true    Ran Out of Food in the Last Year: Never true  Transportation Needs: No Transportation Needs (01/07/2023)  PRAPARE - Administrator, Civil Service (Medical): No    Lack of Transportation (Non-Medical): No  Physical Activity: Insufficiently Active (01/07/2023)   Exercise Vital Sign    Days of Exercise per Week: 3 days    Minutes of Exercise per Session: 30 min  Stress: No Stress Concern Present (01/07/2023)   [employer] of Occupational Health - Occupational Stress Questionnaire    Feeling of Stress : Only a little  Social Connections: Moderately Isolated (01/07/2023)   Social Connection and Isolation Panel [NHANES]    Frequency of Communication with Friends and Family: Three times a week    Frequency of Social Gatherings with Friends and Family: Once a week    Attends Religious Services: Never    Database administrator or Organizations: No     Attends Engineer, structural: Not on file    Marital Status: Living with partner     Family History: The patient's family history includes Asthma in her brother; Breast cancer in her maternal grandmother; COPD in her maternal grandfather; Cancer in her maternal grandfather; Diabetes in her father and maternal grandmother; Early death in her paternal grandfather; Heart attack in her father and paternal grandfather; Heart disease in her father, maternal grandmother, and paternal grandfather; High Cholesterol in her father, maternal grandfather, and maternal grandmother; High blood pressure in her father, maternal grandfather, and maternal grandmother; Hyperlipidemia in her father and sister; Hypertension in her father; Kidney disease in her maternal grandfather; Miscarriages / [place] in her maternal grandmother and mother.  ROS:   Please see the history of present illness.     All other systems reviewed and are negative.  EKGs/Labs/Other Studies Reviewed:    The following studies were reviewed today:   EKG:   02/25/23: Sinus tachycardia, no ST abnormalities  Recent Labs: 01/07/2023: ALT 31; BUN 11; Creatinine, Ser 0.74; Hemoglobin 14.1; Magnesium 1.8; Platelets 285.0; Potassium 3.9; Sodium 137; TSH 2.03  Recent Lipid Panel    Component Value Date/Time   CHOL 182 01/23/2022 1528   TRIG 110 01/23/2022 1528   HDL 52 01/23/2022 1528   CHOLHDL 3.5 01/23/2022 1528   LDLCALC 108 (H) 01/23/2022 1528    Physical Exam:    VS:  BP (!) 138/92 (BP Location: Left Arm, Patient Position: Sitting, Cuff Size: Normal)   Pulse (!) 101   Ht 5\' 7"  (1.702 m)   Wt 153 lb (69.4 kg)   SpO2 98%   BMI 23.96 kg/m     Wt Readings from Last 3 Encounters:  02/25/23 153 lb (69.4 kg)  01/07/23 154 lb 6.4 oz (70 kg)  08/05/22 158 lb 8.2 oz (71.9 kg)     GEN:  Well nourished, well developed in no acute distress HEENT: Normal NECK: No JVD; No carotid bruits LYMPHATICS: No  lymphadenopathy CARDIAC: tachycardic, regular, no murmurs, rubs, gallops RESPIRATORY:  Clear to auscultation without rales, wheezing or rhonchi  ABDOMEN: Soft, non-tender, non-distended MUSCULOSKELETAL:  No edema; No deformity  SKIN: Warm and dry NEUROLOGIC:  Alert and oriented x 3 PSYCHIATRIC:  Normal affect   ASSESSMENT:    1. Tachycardia   2. Palpitations   3. Syncope and collapse   4. DOE (dyspnea on exertion)    PLAN:    Tachycardia/palpitations/lightheadedness: Description concerning for arrhythmia, evaluate with Zio patch x 1 week.  Orthostatics in clinic today do not show evidence of orthostatic hypotension or increase in heart rate with standing to suggest POTS  Dyspnea: Reporting dyspnea with minimal  exertion.  Check echocardiogram to evaluate for structural heart disease  Syncope: Description of consistent with vasovagal syncope.  Check echocardiogram to rule out structural heart disease and Zio patch to evaluate for arrhythmia as above  RTC in 3 months   Medication Adjustments/Labs and Tests Ordered: Current medicines are reviewed at length with the patient today.  Concerns regarding medicines are outlined above.  Orders Placed This Encounter  Procedures   LONG TERM MONITOR (3-14 DAYS)   EKG 12-Lead   ECHOCARDIOGRAM COMPLETE   No orders of the defined types were placed in this encounter.   Patient Instructions  Medication Instructions:  Your physician recommends that you continue on your current medications as directed. Please refer to the Current Medication list given to you today.  *If you need a refill on your cardiac medications before your next appointment, please call your pharmacy*   Lab Work: None needed  If you have labs (blood work) drawn today and your tests are completely normal, you will receive your results only by: MyChart Message (if you have MyChart) OR A paper copy in the mail If you have any lab test that is abnormal or we need to  change your treatment, we will call you to review the results.   Testing/Procedures:  Christena Deem- Long Term Monitor Instructions  Your physician has requested you wear a ZIO patch monitor for 7 days.  This is a single patch monitor. [employer] supplies one patch monitor per enrollment. Additional stickers are not available. Please do not apply patch if you will be having a Nuclear Stress Test,  Echocardiogram, Cardiac CT, MRI, or Chest Xray during the period you would be wearing the  monitor. The patch cannot be worn during these tests. You cannot remove and re-apply the  ZIO XT patch monitor.  Your ZIO patch monitor will be mailed 3 day USPS to your address on file. It may take 3-5 days  to receive your monitor after you have been enrolled.  Once you have received your monitor, please review the enclosed instructions. Your monitor  has already been registered assigning a specific monitor serial # to you.  Billing and Patient Assistance Program Information  We have supplied [employer] with any of your insurance information on file for billing purposes. [employer] offers a sliding scale Patient Assistance Program for patients that do not have  insurance, or whose insurance does not completely cover the cost of the ZIO monitor.  You must apply for the Patient Assistance Program to qualify for this discounted rate.  To apply, please call [employer] at [phone], select option 4, select option 2, ask to apply for  Patient Assistance Program. Meredeth Ide will ask your household income, and how many people  are in your household. They will quote your out-of-pocket cost based on that information.  [employer] will also be able to set up a 61-month, interest-free payment plan if needed.  Applying the monitor   Shave hair from upper left chest.  Hold abrader disc by orange tab. Rub abrader in 40 strokes over the upper left chest as  indicated in your monitor instructions.  Clean area with 4 enclosed alcohol  pads. Let dry.  Apply patch as indicated in monitor instructions. Patch will be placed under collarbone on left  side of chest with arrow pointing upward.  Rub patch adhesive wings for 2 minutes. Remove white label marked "1". Remove the white  label marked "2". Rub patch adhesive wings for 2 additional minutes.  While looking in  a mirror, press and release button in center of patch. A small green light will  flash 3-4 times. This will be your only indicator that the monitor has been turned on.  Do not shower for the first 24 hours. You may shower after the first 24 hours.  Press the button if you feel a symptom. You will hear a small click. Record Date, Time and  Symptom in the Patient Logbook.  When you are ready to remove the patch, follow instructions on the last 2 pages of Patient  Logbook. Stick patch monitor onto the last page of Patient Logbook.  Place Patient Logbook in the blue and white box. Use locking tab on box and tape box closed  securely. The blue and white box has prepaid postage on it. Please place it in the mailbox as  soon as possible. Your physician should have your test results approximately 7 days after the  monitor has been mailed back to [facility].  Call [facility] Customer Care at [phone] if you have questions regarding  your ZIO XT patch monitor. Call them immediately if you see an orange light blinking on your  monitor.  If your monitor falls off in less than 4 days, contact our Monitor department at [phone].  If your monitor becomes loose or falls off after 4 days call [employer] at [phone] for  suggestions on securing your monitor   Your physician has requested that you have an echocardiogram. Echocardiography is a painless test that uses sound waves to create images of your heart. It provides your doctor with information about the size and shape of your heart and how well your heart's chambers and valves are working. This procedure takes  approximately one hour. There are no restrictions for this procedure. Please do NOT wear cologne, perfume, aftershave, or lotions (deodorant is allowed). Please arrive 15 minutes prior to your appointment time.    Follow-Up: At [facility], you and your health needs are our priority.  As part of our continuing mission to provide you with exceptional heart care, we have created designated Provider Care Teams.  These Care Teams include your primary Cardiologist (physician) and Advanced Practice Providers (APPs -  Physician Assistants and Nurse Practitioners) who all work together to provide you with the care you need, when you need it.  We recommend signing up for the patient portal called "MyChart".  Sign up information is provided on this After Visit Summary.  MyChart is used to connect with patients for Virtual Visits (Telemedicine).  Patients are able to view lab/test results, encounter notes, upcoming appointments, etc.  Non-urgent messages can be sent to your provider as well.   To learn more about what you can do with MyChart, go to [email or web address].    Your next appointment:   3 month(s)  Provider:   Little Ishikawa, MD    Signed, Little Ishikawa, MD  02/25/2023 5:15 PM    [facility]

## 2023-02-25 ENCOUNTER — Ambulatory Visit: Payer: 59 | Attending: Cardiology | Admitting: Cardiology

## 2023-02-25 ENCOUNTER — Ambulatory Visit: Payer: 59 | Attending: Cardiology

## 2023-02-25 ENCOUNTER — Encounter: Payer: Self-pay | Admitting: Cardiology

## 2023-02-25 VITALS — BP 138/92 | HR 101 | Ht 67.0 in | Wt 153.0 lb

## 2023-02-25 DIAGNOSIS — R55 Syncope and collapse: Secondary | ICD-10-CM | POA: Diagnosis not present

## 2023-02-25 DIAGNOSIS — R0609 Other forms of dyspnea: Secondary | ICD-10-CM

## 2023-02-25 DIAGNOSIS — R002 Palpitations: Secondary | ICD-10-CM | POA: Diagnosis not present

## 2023-02-25 DIAGNOSIS — R000 Tachycardia, unspecified: Secondary | ICD-10-CM | POA: Diagnosis not present

## 2023-02-25 NOTE — Progress Notes (Unsigned)
Enrolled for [employer] to mail a ZIO XT long term holter monitor to the patients address on file.  

## 2023-02-25 NOTE — Patient Instructions (Signed)
Medication Instructions:  Your physician recommends that you continue on your current medications as directed. Please refer to the Current Medication list given to you today.  *If you need a refill on your cardiac medications before your next appointment, please call your pharmacy*   Lab Work: None needed  If you have labs (blood work) drawn today and your tests are completely normal, you will receive your results only by: MyChart Message (if you have MyChart) OR A paper copy in the mail If you have any lab test that is abnormal or we need to change your treatment, we will call you to review the results.   Testing/Procedures:  Christena Deem- Long Term Monitor Instructions  Your physician has requested you wear a ZIO patch monitor for 7 days.  This is a single patch monitor. [employer] supplies one patch monitor per enrollment. Additional stickers are not available. Please do not apply patch if you will be having a Nuclear Stress Test,  Echocardiogram, Cardiac CT, MRI, or Chest Xray during the period you would be wearing the  monitor. The patch cannot be worn during these tests. You cannot remove and re-apply the  ZIO XT patch monitor.  Your ZIO patch monitor will be mailed 3 day USPS to your address on file. It may take 3-5 days  to receive your monitor after you have been enrolled.  Once you have received your monitor, please review the enclosed instructions. Your monitor  has already been registered assigning a specific monitor serial # to you.  Billing and Patient Assistance Program Information  We have supplied [employer] with any of your insurance information on file for billing purposes. [employer] offers a sliding scale Patient Assistance Program for patients that do not have  insurance, or whose insurance does not completely cover the cost of the ZIO monitor.  You must apply for the Patient Assistance Program to qualify for this discounted rate.  To apply, please call [employer] at  [phone], select option 4, select option 2, ask to apply for  Patient Assistance Program. Meredeth Ide will ask your household income, and how many people  are in your household. They will quote your out-of-pocket cost based on that information.  [employer] will also be able to set up a 35-month, interest-free payment plan if needed.  Applying the monitor   Shave hair from upper left chest.  Hold abrader disc by orange tab. Rub abrader in 40 strokes over the upper left chest as  indicated in your monitor instructions.  Clean area with 4 enclosed alcohol pads. Let dry.  Apply patch as indicated in monitor instructions. Patch will be placed under collarbone on left  side of chest with arrow pointing upward.  Rub patch adhesive wings for 2 minutes. Remove white label marked "1". Remove the white  label marked "2". Rub patch adhesive wings for 2 additional minutes.  While looking in a mirror, press and release button in center of patch. A small green light will  flash 3-4 times. This will be your only indicator that the monitor has been turned on.  Do not shower for the first 24 hours. You may shower after the first 24 hours.  Press the button if you feel a symptom. You will hear a small click. Record Date, Time and  Symptom in the Patient Logbook.  When you are ready to remove the patch, follow instructions on the last 2 pages of Patient  Logbook. Stick patch monitor onto the last page of Patient Logbook.  Place  Patient Logbook in the blue and white box. Use locking tab on box and tape box closed  securely. The blue and white box has prepaid postage on it. Please place it in the mailbox as  soon as possible. Your physician should have your test results approximately 7 days after the  monitor has been mailed back to [facility].  Call [id number] Customer Care at [phone] if you have questions regarding  your ZIO XT patch monitor. Call them immediately if you see an orange light  blinking on your  monitor.  If your monitor falls off in less than 4 days, contact our Monitor department at [phone].  If your monitor becomes loose or falls off after 4 days call [employer] at [phone] for  suggestions on securing your monitor   Your physician has requested that you have an echocardiogram. Echocardiography is a painless test that uses sound waves to create images of your heart. It provides your doctor with information about the size and shape of your heart and how well your heart's chambers and valves are working. This procedure takes approximately one hour. There are no restrictions for this procedure. Please do NOT wear cologne, perfume, aftershave, or lotions (deodorant is allowed). Please arrive 15 minutes prior to your appointment time.    Follow-Up: At [facility], you and your health needs are our priority.  As part of our continuing mission to provide you with exceptional heart care, we have created designated Provider Care Teams.  These Care Teams include your primary Cardiologist (physician) and Advanced Practice Providers (APPs -  Physician Assistants and Nurse Practitioners) who all work together to provide you with the care you need, when you need it.  We recommend signing up for the patient portal called "MyChart".  Sign up information is provided on this After Visit Summary.  MyChart is used to connect with patients for Virtual Visits (Telemedicine).  Patients are able to view lab/test results, encounter notes, upcoming appointments, etc.  Non-urgent messages can be sent to your provider as well.   To learn more about what you can do with MyChart, go to [email or web address].    Your next appointment:   3 month(s)  Provider:   Little Ishikawa, MD

## 2023-03-01 DIAGNOSIS — R55 Syncope and collapse: Secondary | ICD-10-CM | POA: Diagnosis not present

## 2023-03-01 DIAGNOSIS — R002 Palpitations: Secondary | ICD-10-CM | POA: Diagnosis not present

## 2023-03-15 DIAGNOSIS — R55 Syncope and collapse: Secondary | ICD-10-CM | POA: Diagnosis not present

## 2023-03-15 DIAGNOSIS — R002 Palpitations: Secondary | ICD-10-CM | POA: Diagnosis not present

## 2023-04-13 ENCOUNTER — Ambulatory Visit (HOSPITAL_COMMUNITY): Payer: 59 | Attending: Cardiology

## 2023-04-13 DIAGNOSIS — R55 Syncope and collapse: Secondary | ICD-10-CM | POA: Diagnosis not present

## 2023-04-13 DIAGNOSIS — R002 Palpitations: Secondary | ICD-10-CM | POA: Insufficient documentation

## 2023-04-13 LAB — ECHOCARDIOGRAM COMPLETE
Area-P 1/2: 4.39 cm2
S' Lateral: 2.9 cm

## 2023-06-06 NOTE — Progress Notes (Deleted)
Cardiology Office Note:    Date:  06/06/2023   ID:  Heidi Mccormick, DOB 08/21/2000, MRN [id number]  PCP:  Jeoffrey Massed, MD  Cardiologist:  Little Ishikawa, MD  Electrophysiologist:  None   Referring MD: Jeoffrey Massed, MD   No chief complaint on file.   History of Present Illness:    Heidi Mccormick is a 22 y.o. female with a hx of GERD who presents for follow-up.  She was referred by Dr. Milinda Cave for evaluation of tachycardia, initially seen 02/25/2019.  She reports has been having episodes where she feels like her heart is racing.  States that she will just be resting and heart will start racing, can last up to 30 seconds.  She feels lightheaded during episodes.  She also reports multiple syncopal episodes, though none recently.  During episode she feels diaphoretic and nauseous and then passes out.  Also reports she has been getting short of breath with minimal exertion, states that walking up stairs or trying to walk on treadmill will feel short of breath and sometimes have chest pain.  Also reports swelling in her legs.  No smoking history.  Family history includes father had multiple MIs, starting in 30s.  Zio patch x 7 days 02/2023 showed no significant arrhythmias.  Echocardiogram 04/13/2023 showed normal biventricular function, no significant valvular disease.  Since last clinic visit,   Past Medical History:  Diagnosis Date   Abnormal vaginal bleeding    Dr. Aldona Bar   Acid reflux    Acne vulgaris    Headache syndrome    summer 2022   History of community acquired pneumonia 05/2021   Menometrorrhagia    Started Yaz 01/30/19   Patellofemoral arthritis    bilat 2023   Vasovagal syncope    summer 2022    Past Surgical History:  Procedure Laterality Date   KNEE ARTHROSCOPY WITH LATERAL RELEASE Left 08/05/2022   Procedure: LEFT KNEE ARTHROSCOPY, LATERAL RELEASE, MICROFRACTURE;  Surgeon: Tarry Kos, MD;  Location: [facility];  Service:  Orthopedics;  Laterality: Left;   NO PAST SURGERIES      Current Medications: No outpatient medications have been marked as taking for the 06/08/23 encounter (Appointment) with Little Ishikawa, MD.     Allergies:   Patient has no known allergies.   Social History   Socioeconomic History   Marital status: Single    Spouse name: Not on file   Number of children: 0   Years of education: Not on file   Highest education level: 12th grade  Occupational History    Comment: Data processing manager  Tobacco Use   Smoking status: Never   Smokeless tobacco: Never  Vaping Use   Vaping status: Never Used  Substance and Sexual Activity   Alcohol use: Never   Drug use: Never   Sexual activity: Yes    Birth control/protection: Condom  Other Topics Concern   Not on file  Social History Narrative   Single, no children.   Lives at home with parents as of 01/2019.   Educ: HS--[employer].   Occup: Data processing manager   No T/A/Ds   Caffeine- coffee 1 c daily   Social Determinants of Health   Financial Resource Strain: Low Risk  (01/07/2023)   Overall Financial Resource Strain (CARDIA)    Difficulty of Paying Living Expenses: Not very hard  Food Insecurity: No Food Insecurity (01/07/2023)   Hunger Vital Sign    Worried About Running Out of Food  in the Last Year: Never true    Ran Out of Food in the Last Year: Never true  Transportation Needs: No Transportation Needs (01/07/2023)   PRAPARE - Administrator, Civil Service (Medical): No    Lack of Transportation (Non-Medical): No  Physical Activity: Insufficiently Active (01/07/2023)   Exercise Vital Sign    Days of Exercise per Week: 3 days    Minutes of Exercise per Session: 30 min  Stress: No Stress Concern Present (01/07/2023)   [employer] of Occupational Health - Occupational Stress Questionnaire    Feeling of Stress : Only a little  Social Connections: Moderately Isolated (01/07/2023)   Social Connection and Isolation Panel  [NHANES]    Frequency of Communication with Friends and Family: Three times a week    Frequency of Social Gatherings with Friends and Family: Once a week    Attends Religious Services: Never    Database administrator or Organizations: No    Attends Engineer, structural: Not on file    Marital Status: Living with partner     Family History: The patient's family history includes Asthma in her brother; Breast cancer in her maternal grandmother; COPD in her maternal grandfather; Cancer in her maternal grandfather; Diabetes in her father and maternal grandmother; Early death in her paternal grandfather; Heart attack in her father and paternal grandfather; Heart disease in her father, maternal grandmother, and paternal grandfather; High Cholesterol in her father, maternal grandfather, and maternal grandmother; High blood pressure in her father, maternal grandfather, and maternal grandmother; Hyperlipidemia in her father and sister; Hypertension in her father; Kidney disease in her maternal grandfather; Miscarriages / [place] in her maternal grandmother and mother.  ROS:   Please see the history of present illness.     All other systems reviewed and are negative.  EKGs/Labs/Other Studies Reviewed:    The following studies were reviewed today:   EKG:   02/25/23: Sinus tachycardia, no ST abnormalities  Recent Labs: 01/07/2023: ALT 31; BUN 11; Creatinine, Ser 0.74; Hemoglobin 14.1; Magnesium 1.8; Platelets 285.0; Potassium 3.9; Sodium 137; TSH 2.03  Recent Lipid Panel    Component Value Date/Time   CHOL 182 01/23/2022 1528   TRIG 110 01/23/2022 1528   HDL 52 01/23/2022 1528   CHOLHDL 3.5 01/23/2022 1528   LDLCALC 108 (H) 01/23/2022 1528    Physical Exam:    VS:  There were no vitals taken for this visit.    Wt Readings from Last 3 Encounters:  02/25/23 153 lb (69.4 kg)  01/07/23 154 lb 6.4 oz (70 kg)  08/05/22 158 lb 8.2 oz (71.9 kg)     GEN:  Well nourished, well  developed in no acute distress HEENT: Normal NECK: No JVD; No carotid bruits LYMPHATICS: No lymphadenopathy CARDIAC: tachycardic, regular, no murmurs, rubs, gallops RESPIRATORY:  Clear to auscultation without rales, wheezing or rhonchi  ABDOMEN: Soft, non-tender, non-distended MUSCULOSKELETAL:  No edema; No deformity  SKIN: Warm and dry NEUROLOGIC:  Alert and oriented x 3 PSYCHIATRIC:  Normal affect   ASSESSMENT:    No diagnosis found.  PLAN:    Tachycardia/palpitations/lightheadedness: Zio patch x 7 days 02/2023 showed no significant arrhythmias.  Echocardiogram 04/13/2023 showed normal biventricular function, no significant valvular disease. Orthostatics at prior clinic visit do not show evidence of orthostatic hypotension or increase in heart rate with standing to suggest POTS  Dyspnea: Reporting dyspnea with minimal exertion.  Echocardiogram unremarkable as above  Syncope: Description of consistent with vasovagal syncope.  Zio patch and echocardiogram unremarkable as above  RTC in 3 months***   Medication Adjustments/Labs and Tests Ordered: Current medicines are reviewed at length with the patient today.  Concerns regarding medicines are outlined above.  No orders of the defined types were placed in this encounter.  No orders of the defined types were placed in this encounter.   There are no Patient Instructions on file for this visit.   Signed, Little Ishikawa, MD  06/06/2023 2:42 PM    [facility]

## 2023-06-08 ENCOUNTER — Ambulatory Visit: Payer: 59 | Admitting: Cardiology

## 2023-06-25 ENCOUNTER — Ambulatory Visit
Admission: RE | Admit: 2023-06-25 | Discharge: 2023-06-25 | Disposition: A | Discharge status: Home / Self Care | Payer: 59 | Source: Ambulatory Visit | Attending: Family Medicine | Admitting: Family Medicine

## 2023-06-25 VITALS — BP 114/92 | HR 118 | Temp 98.2°F | Resp 16

## 2023-06-25 DIAGNOSIS — J020 Streptococcal pharyngitis: Secondary | ICD-10-CM

## 2023-06-25 DIAGNOSIS — J029 Acute pharyngitis, unspecified: Secondary | ICD-10-CM

## 2023-06-25 DIAGNOSIS — T7840XA Allergy, unspecified, initial encounter: Secondary | ICD-10-CM

## 2023-06-25 LAB — POCT RAPID STREP A (OFFICE): Rapid Strep A Screen: POSITIVE — AB

## 2023-06-25 MED ORDER — AMOXICILLIN 875 MG PO TABS: 875.0000 mg | ORAL_TABLET | Freq: Two times a day (BID) | ORAL | 0 refills | Status: AC

## 2023-06-25 MED ORDER — PREDNISONE 20 MG PO TABS: ORAL_TABLET | ORAL | 0 refills | Status: DC

## 2023-06-25 NOTE — ED Triage Notes (Signed)
Pt presents to [facility] with co of sore throat and difficulty talking since taking a new supplement. Pt denies sore throat prior to incident and reports she has not had any benadryl

## 2023-06-25 NOTE — ED Provider Notes (Signed)
[facility]    CSN: [id number] Arrival date & time: 06/25/23  0805      History   Chief Complaint Chief Complaint  Patient presents with   Sore Throat    Entered by patient   Allergic Reaction    HPI Heidi Mccormick is a 22 y.o. female.   HPI pleasant 22 year old female presents with sore throat and difficulty swallowing/speaking since taking new supplement.  Patient denies sore throat prior to taking supplement.  PMH significant for headache syndrome, menometrorrhagia, and acne vulgaris.  Past Medical History:  Diagnosis Date   Abnormal vaginal bleeding    Dr. Aldona Bar   Acid reflux    Acne vulgaris    Headache syndrome    summer 2022   History of community acquired pneumonia 05/2021   Menometrorrhagia    Started Yaz 01/30/19   Patellofemoral arthritis    bilat 2023   Vasovagal syncope    summer 2022    Patient Active Problem List   Diagnosis Date Noted   Polyarthralgia 07/17/2022   Neck and shoulder pain, left 05/15/2022   Chronic low back pain 05/15/2022   Patellofemoral arthritis 11/10/2021   Migraine 05/29/2021    Past Surgical History:  Procedure Laterality Date   KNEE ARTHROSCOPY WITH LATERAL RELEASE Left 08/05/2022   Procedure: LEFT KNEE ARTHROSCOPY, LATERAL RELEASE, MICROFRACTURE;  Surgeon: Tarry Kos, MD;  Location: [facility];  Service: Orthopedics;  Laterality: Left;   NO PAST SURGERIES      OB History     Gravida  0   Para  0   Term  0   Preterm  0   AB  0   Living  0      SAB  0   IAB  0   Ectopic  0   Multiple  0   Live Births  0            Home Medications    Prior to Admission medications   Medication Sig Start Date End Date Taking? Authorizing Provider  amoxicillin (AMOXIL) 875 MG tablet Take 1 tablet (875 mg total) by mouth 2 (two) times daily for 7 days. 06/25/23 07/02/23 Yes Trevor Iha, FNP  predniSONE (DELTASONE) 20 MG tablet Take 3 tabs PO daily x 5 days. 06/25/23  Yes  Trevor Iha, FNP  benzoyl peroxide-erythromycin Lifescape) gel Apply topically 2 (two) times daily. 01/06/23   Viviano Simas, FNP  pantoprazole (PROTONIX) 40 MG tablet Take 1 tablet (40 mg total) by mouth daily. 01/23/22   McGowen, Maryjean Morn, MD    Family History Family History  Problem Relation Age of Onset   Miscarriages / Stillbirths Mother    Diabetes Father    Heart attack Father    Heart disease Father    High Cholesterol Father    High blood pressure Father    Hyperlipidemia Father    Hypertension Father    Asthma Brother    Diabetes Maternal Grandmother    Heart disease Maternal Grandmother    High Cholesterol Maternal Grandmother    High blood pressure Maternal Grandmother    Miscarriages / Stillbirths Maternal Grandmother    Breast cancer Maternal Grandmother    Cancer Maternal Grandfather    COPD Maternal Grandfather    High Cholesterol Maternal Grandfather    High blood pressure Maternal Grandfather    Kidney disease Maternal Grandfather    Early death Paternal Grandfather    Heart attack Paternal Grandfather    Heart  disease Paternal Grandfather    Hyperlipidemia Sister     Social History Social History   Tobacco Use   Smoking status: Never   Smokeless tobacco: Never  Vaping Use   Vaping status: Never Used  Substance Use Topics   Alcohol use: Never   Drug use: Never     Allergies   Patient has no known allergies.   Review of Systems Review of Systems  HENT:  Positive for sore throat.   All other systems reviewed and are negative.    Physical Exam Triage Vital Signs ED Triage Vitals  Encounter Vitals Group     BP 06/25/23 0815 (!) 114/92     Systolic BP Percentile --      Diastolic BP Percentile --      Pulse Rate 06/25/23 0815 (!) 118     Resp 06/25/23 0815 16     Temp 06/25/23 0815 98.2 F (36.8 C)     Temp src --      SpO2 06/25/23 0815 98 %     Weight --      Height --      Head Circumference --      Peak Flow --      Pain  Score 06/25/23 0814 9     Pain Loc --      Pain Education --      Exclude from Growth Chart --    No data found.  Updated Vital Signs BP (!) 114/92   Pulse (!) 118   Temp 98.2 F (36.8 C)   Resp 16   LMP 05/31/2023   SpO2 98%    Physical Exam Vitals and nursing note reviewed.  Constitutional:      Appearance: She is well-developed and normal weight. She is ill-appearing.  HENT:     Head: Normocephalic and atraumatic.     Right Ear: Tympanic membrane and ear canal normal.     Left Ear: Tympanic membrane and ear canal normal.     Mouth/Throat:     Mouth: Mucous membranes are moist.     Pharynx: Uvula midline. Pharyngeal swelling, posterior oropharyngeal erythema and uvula swelling present.     Tonsils: 4+ on the right. 4+ on the left.  Eyes:     Conjunctiva/sclera: Conjunctivae normal.     Pupils: Pupils are equal, round, and reactive to light.  Cardiovascular:     Rate and Rhythm: Normal rate and regular rhythm.     Heart sounds: Normal heart sounds.  Pulmonary:     Effort: Pulmonary effort is normal.     Breath sounds: Normal breath sounds. No wheezing, rhonchi or rales.  Musculoskeletal:     Cervical back: Normal range of motion and neck supple.  Skin:    General: Skin is warm and dry.  Neurological:     General: No focal deficit present.     Mental Status: She is alert and oriented to person, place, and time.  Psychiatric:        Mood and Affect: Mood normal.        Behavior: Behavior normal.      UC Treatments / Results  Labs (all labs ordered are listed, but only abnormal results are displayed) Labs Reviewed  POCT RAPID STREP A (OFFICE) - Abnormal; Notable for the following components:      Result Value   Rapid Strep A Screen Positive (*)    All other components within normal limits    EKG   Radiology No results  found.  Procedures Procedures (including critical care time)  Medications Ordered in UC Medications - No data to display  Initial  Impression / Assessment and Plan / UC Course  I have reviewed the triage vital signs and the nursing notes.  Pertinent labs & imaging results that were available during my care of the patient were reviewed by me and considered in my medical decision making (see chart for details).    MDM: 1.  Strep pharyngitis-rapid strep positive, Rx'd amoxicillin 875 mg: Take 1 tablet twice daily x 7 days; 2.  Allergic reaction, initial encounter-Rx prednisone 20 mg tablet: Take 3 tablets p.o. once daily x 5 days.  Advised patient to take medication as directed with food to completion.  Advised if symptoms worsen and/or unresolved please go to [facility] ED immediately for further evaluation. Advised patient to take medications as directed with food to completion.  Advised patient to take prednisone with first dose of amoxicillin for the next 5 of 7 days.  Encouraged to increase daily water intake to 64 ounces per day while taking these medications.  Advised if symptoms worsen and/or unresolved please follow-up PCP, here, or please go to [facility] ED immediately for further evaluation.  Discharged home, hemodynamically stable. Final Clinical Impressions(s) / UC Diagnoses   Final diagnoses:  Allergic reaction, initial encounter  Sore throat  Strep pharyngitis     Discharge Instructions      Advised patient to take medications as directed with food to completion.  Advised patient to take prednisone with first dose of amoxicillin for the next 5 of 7 days.  Encouraged to increase daily water intake to 64 ounces per day while taking these medications.  Advised if symptoms worsen and/or unresolved please follow-up PCP, here, or please go to [facility] ED immediately for further evaluation.     ED Prescriptions     Medication Sig Dispense Auth. Provider   amoxicillin (AMOXIL) 875 MG tablet Take 1 tablet (875 mg total) by mouth 2 (two) times  daily for 7 days. 14 tablet Trevor Iha, FNP   predniSONE (DELTASONE) 20 MG tablet Take 3 tabs PO daily x 5 days. 15 tablet Trevor Iha, FNP      PDMP not reviewed this encounter.   Trevor Iha, FNP 06/25/23 0900

## 2023-06-25 NOTE — Discharge Instructions (Addendum)
Advised patient to take medications as directed with food to completion.  Advised patient to take prednisone with first dose of amoxicillin for the next 5 of 7 days.  Encouraged to increase daily water intake to 64 ounces per day while taking these medications.  Advised if symptoms worsen and/or unresolved please follow-up PCP, here, or please go to [facility] ED immediately for further evaluation.

## 2023-07-13 ENCOUNTER — Telehealth: Payer: 59 | Admitting: Physician Assistant

## 2023-07-13 DIAGNOSIS — J208 Acute bronchitis due to other specified organisms: Secondary | ICD-10-CM

## 2023-07-13 DIAGNOSIS — B9689 Other specified bacterial agents as the cause of diseases classified elsewhere: Secondary | ICD-10-CM | POA: Diagnosis not present

## 2023-07-13 MED ORDER — ALBUTEROL SULFATE HFA 108 (90 BASE) MCG/ACT IN AERS: 2.0000 | INHALATION_SPRAY | Freq: Four times a day (QID) | RESPIRATORY_TRACT | 0 refills | Status: DC | PRN

## 2023-07-13 MED ORDER — BENZONATATE 100 MG PO CAPS: 100.0000 mg | ORAL_CAPSULE | Freq: Three times a day (TID) | ORAL | 0 refills | Status: DC | PRN

## 2023-07-13 MED ORDER — AZITHROMYCIN 250 MG PO TABS: ORAL_TABLET | ORAL | 0 refills | Status: AC

## 2023-07-13 NOTE — Progress Notes (Signed)
E-Visit for Cough   We are sorry that you are not feeling well.  Here is how we plan to help!  Based on your presentation I believe you most likely have A cough due to bacteria.  When patients have a fever and a productive cough with a change in color or increased sputum production, we are concerned about bacterial bronchitis.  If left untreated it can progress to pneumonia.  If your symptoms do not improve with your treatment plan it is important that you contact your provider.   I have prescribed Azithromyin 250 mg: two tablets now and then one tablet daily for 4 additonal days    In addition you may use A prescription cough medication called Tessalon Perles 100mg . You may take 1-2 capsules every 8 hours as needed for your cough.  I have also sent in an albuterol inhaler to use as directed.  From your responses in the eVisit questionnaire you describe inflammation in the upper respiratory tract which is causing a significant cough.  This is commonly called Bronchitis and has four common causes:   Allergies Viral Infections Acid Reflux Bacterial Infection Allergies, viruses and acid reflux are treated by controlling symptoms or eliminating the cause. An example might be a cough caused by taking certain blood pressure medications. You stop the cough by changing the medication. Another example might be a cough caused by acid reflux. Controlling the reflux helps control the cough.  USE OF BRONCHODILATOR ("RESCUE") INHALERS: There is a risk from using your bronchodilator too frequently.  The risk is that over-reliance on a medication which only relaxes the muscles surrounding the breathing tubes can reduce the effectiveness of medications prescribed to reduce swelling and congestion of the tubes themselves.  Although you feel brief relief from the bronchodilator inhaler, your asthma may actually be worsening with the tubes becoming more swollen and filled with mucus.  This can delay other crucial  treatments, such as oral steroid medications. If you need to use a bronchodilator inhaler daily, several times per day, you should discuss this with your provider.  There are probably better treatments that could be used to keep your asthma under control.     HOME CARE Only take medications as instructed by your medical team. Complete the entire course of an antibiotic. Drink plenty of fluids and get plenty of rest. Avoid close contacts especially the very young and the elderly Cover your mouth if you cough or cough into your sleeve. Always remember to wash your hands A steam or ultrasonic humidifier can help congestion.   GET HELP RIGHT AWAY IF: You develop worsening fever. You become short of breath You cough up blood. Your symptoms persist after you have completed your treatment plan MAKE SURE YOU  Understand these instructions. Will watch your condition. Will get help right away if you are not doing well or get worse.    Thank you for choosing an e-visit.  Your e-visit answers were reviewed by a board certified advanced clinical practitioner to complete your personal care plan. Depending upon the condition, your plan could have included both over the counter or prescription medications.  Please review your pharmacy choice. Make sure the pharmacy is open so you can pick up prescription now. If there is a problem, you may contact your provider through [employer] and have the prescription routed to another pharmacy.  Your safety is important to [place]. If you have drug allergies check your prescription carefully.   For the next 24 hours  you can use MyChart to ask questions about today's visit, request a non-urgent call back, or ask for a work or school excuse. You will get an email in the next two days asking about your experience. I hope that your e-visit has been valuable and will speed your recovery.

## 2023-07-13 NOTE — Progress Notes (Signed)
I have spent 5 minutes in review of e-visit questionnaire, review and updating patient chart, medical decision making and response to patient.   Mia Milan Cody Jacklynn Dehaas, PA-C    

## 2023-07-13 NOTE — Progress Notes (Signed)
Message sent to patient requesting further input regarding current symptoms. Awaiting patient response.  

## 2023-07-14 NOTE — Progress Notes (Unsigned)
Cardiology Office Note:    Date:  07/15/2023   ID:  Heidi Mccormick, DOB [birth year], MRN [id number]  PCP:  Heidi Massed, MD  Cardiologist:  Little Ishikawa, MD  Electrophysiologist:  None   Referring MD: Heidi Massed, MD   Chief Complaint  Patient presents with   Chest Pain    History of Present Illness:    Heidi Mccormick is a 22 y.o. female with a hx of GERD who presents for follow-up.  She was referred by Dr. Milinda Cave for evaluation of tachycardia, initially seen 02/25/2023.  She reports has been having episodes where she feels like her heart is racing.  States that she will just be resting and heart will start racing, can last up to 30 seconds.  She feels lightheaded during episodes.  She also reports multiple syncopal episodes, though none recently.  During episode she feels diaphoretic and nauseous and then passes out.  Also reports she has been getting short of breath with minimal exertion, states that walking up stairs or trying to walk on treadmill will feel short of breath and sometimes have chest pain.  Also reports swelling in her legs.  No smoking history.  Family history includes father had multiple MIs, starting in 30s.  Zio patch x 7 days 02/2023 showed no significant arrhythmias.  Echocardiogram 04/13/2023 showed normal biventricular function, no significant valvular disease.  Since last clinic visit, she reports she is continuing to have episodes where her heart is racing.  Particularly occurs when she lies down or if she tries to work out.  She also reports she gets very short of breath with minimal exertion.  When she tries to walk on treadmill she will get very short of breath and also have chest pain.  This has kept her from exercising.  She denies any syncopal episodes but does report near syncope.  States that she will feel lightheaded and diaphoretic and nauseous but will sit down until it passes.  Past Medical History:  Diagnosis Date   Abnormal vaginal  bleeding    Dr. Aldona Bar   Acid reflux    Acne vulgaris    Headache syndrome    summer 2022   History of community acquired pneumonia 05/2021   Menometrorrhagia    Started Yaz 01/30/19   Patellofemoral arthritis    bilat 2023   Vasovagal syncope    summer 2022    Past Surgical History:  Procedure Laterality Date   KNEE ARTHROSCOPY WITH LATERAL RELEASE Left 08/05/2022   Procedure: LEFT KNEE ARTHROSCOPY, LATERAL RELEASE, MICROFRACTURE;  Surgeon: Tarry Kos, MD;  Location: [facility];  Service: Orthopedics;  Laterality: Left;   NO PAST SURGERIES      Current Medications: Current Meds  Medication Sig   albuterol (VENTOLIN HFA) 108 (90 Base) MCG/ACT inhaler Inhale 2 puffs into the lungs every 6 (six) hours as needed for wheezing or shortness of breath.   azithromycin (ZITHROMAX) 250 MG tablet Take 2 tablets on day 1, then 1 tablet daily on days 2 through 5   benzonatate (TESSALON) 100 MG capsule Take 1 capsule (100 mg total) by mouth 3 (three) times daily as needed for cough.     Allergies:   Patient has no known allergies.   Social History   Socioeconomic History   Marital status: Single    Spouse name: Not on file   Number of children: 0   Years of education: Not on file   Highest education level:  12th grade  Occupational History    Comment: Data processing manager  Tobacco Use   Smoking status: Never   Smokeless tobacco: Never  Vaping Use   Vaping status: Never Used  Substance and Sexual Activity   Alcohol use: Never   Drug use: Never   Sexual activity: Yes    Birth control/protection: Condom  Other Topics Concern   Not on file  Social History Narrative   Single, no children.   Lives at home with parents as of 01/2019.   Educ: HS--[employer].   Occup: Data processing manager   No T/A/Ds   Caffeine- coffee 1 c daily   Social Drivers of Corporate investment banker Strain: Low Risk  (01/07/2023)   Overall Financial Resource Strain (CARDIA)    Difficulty of Paying  Living Expenses: Not very hard  Food Insecurity: No Food Insecurity (01/07/2023)   Hunger Vital Sign    Worried About Running Out of Food in the Last Year: Never true    Ran Out of Food in the Last Year: Never true  Transportation Needs: No Transportation Needs (01/07/2023)   PRAPARE - Administrator, Civil Service (Medical): No    Lack of Transportation (Non-Medical): No  Physical Activity: Insufficiently Active (01/07/2023)   Exercise Vital Sign    Days of Exercise per Week: 3 days    Minutes of Exercise per Session: 30 min  Stress: No Stress Concern Present (01/07/2023)   [employer] of Occupational Health - Occupational Stress Questionnaire    Feeling of Stress : Only a little  Social Connections: Moderately Isolated (01/07/2023)   Social Connection and Isolation Panel [NHANES]    Frequency of Communication with Friends and Family: Three times a week    Frequency of Social Gatherings with Friends and Family: Once a week    Attends Religious Services: Never    Database administrator or Organizations: No    Attends Engineer, structural: Not on file    Marital Status: Living with partner     Family History: The patient's family history includes Asthma in her brother; Breast cancer in her maternal grandmother; COPD in her maternal grandfather; Cancer in her maternal grandfather; Diabetes in her father and maternal grandmother; Early death in her paternal grandfather; Heart attack in her father and paternal grandfather; Heart disease in her father, maternal grandmother, and paternal grandfather; High Cholesterol in her father, maternal grandfather, and maternal grandmother; High blood pressure in her father, maternal grandfather, and maternal grandmother; Hyperlipidemia in her father and sister; Hypertension in her father; Kidney disease in her maternal grandfather; Miscarriages / [place] in her maternal grandmother and mother.  ROS:   Please see the history of  present illness.     All other systems reviewed and are negative.  EKGs/Labs/Other Studies Reviewed:    The following studies were reviewed today:   EKG:   02/25/23: Sinus tachycardia, no ST abnormalities  Recent Labs: 01/07/2023: ALT 31; BUN 11; Creatinine, Ser 0.74; Hemoglobin 14.1; Magnesium 1.8; Platelets 285.0; Potassium 3.9; Sodium 137; TSH 2.03  Recent Lipid Panel    Component Value Date/Time   CHOL 182 01/23/2022 1528   TRIG 110 01/23/2022 1528   HDL 52 01/23/2022 1528   CHOLHDL 3.5 01/23/2022 1528   LDLCALC 108 (H) 01/23/2022 1528    Physical Exam:    VS:  BP 114/74   Pulse 96   Ht 5\' 6"  (1.676 m)   Wt 144 lb (65.3 kg)   LMP 05/31/2023  SpO2 96%   BMI 23.24 kg/m     Wt Readings from Last 3 Encounters:  07/15/23 144 lb (65.3 kg)  02/25/23 153 lb (69.4 kg)  01/07/23 154 lb 6.4 oz (70 kg)     GEN:  Well nourished, well developed in no acute distress HEENT: Normal NECK: No JVD; No carotid bruits LYMPHATICS: No lymphadenopathy CARDIAC: tachycardic, regular, no murmurs, rubs, gallops RESPIRATORY:  Clear to auscultation without rales, wheezing or rhonchi  ABDOMEN: Soft, non-tender, non-distended MUSCULOSKELETAL:  No edema; No deformity  SKIN: Warm and dry NEUROLOGIC:  Alert and oriented x 3 PSYCHIATRIC:  Normal affect   ASSESSMENT:    1. Chest pain of uncertain etiology   2. Palpitations   3. Syncope and collapse   4. Tachycardia   5. DOE (dyspnea on exertion)     PLAN:    Tachycardia/palpitations/lightheadedness: Orthostatics in clinic visit 02/2023 do not show evidence of orthostatic hypotension or increase in heart rate with standing to suggest POTS.  Zio patch x 7 days 02/2023 showed no significant arrhythmias.  Echocardiogram 04/13/2023 showed normal biventricular function, no significant valvular disease.  Dyspnea/chest pain: Reporting dyspnea with minimal exertion.  Echocardiogram 03/2023 with no structural heart disease -Reports she has been  trying to walk on treadmill but becomes too short of breath and has chest pain.  Will evaluate with ETT.  Syncope: Description consistent with vasovagal syncope.  Echocardiogram and Zio patch unremarkable as above.  Discussed if having prodromal symptoms lying on ground until symptoms pass to prevent syncope   RTC in 1 year  Informed Consent   Shared Decision Making/Informed Consent The risks [chest pain, shortness of breath, cardiac arrhythmias, dizziness, blood pressure fluctuations, myocardial infarction, stroke/transient ischemic attack, and life-threatening complications (estimated to be 1 in 10,000)], benefits (risk stratification, diagnosing coronary artery disease, treatment guidance) and alternatives of an exercise tolerance test were discussed in detail with Ms. Tuller and she agrees to proceed.       Medication Adjustments/Labs and Tests Ordered: Current medicines are reviewed at length with the patient today.  Concerns regarding medicines are outlined above.  Orders Placed This Encounter  Procedures   EXERCISE TOLERANCE TEST (ETT)   No orders of the defined types were placed in this encounter.   Patient Instructions  Medication Instructions:  Continue same medications *If you need a refill on your cardiac medications before your next appointment, please call your pharmacy*   Lab Work: None ordered   Testing/Procedures: Exercise Tolerance Test  first available    Follow-Up: At [facility], you and your health needs are our priority.  As part of our continuing mission to provide you with exceptional heart care, we have created designated Provider Care Teams.  These Care Teams include your primary Cardiologist (physician) and Advanced Practice Providers (APPs -  Physician Assistants and Nurse Practitioners) who all work together to provide you with the care you need, when you need it.  We recommend signing up for the patient portal called "MyChart".  Sign up  information is provided on this After Visit Summary.  MyChart is used to connect with patients for Virtual Visits (Telemedicine).  Patients are able to view lab/test results, encounter notes, upcoming appointments, etc.  Non-urgent messages can be sent to your provider as well.   To learn more about what you can do with MyChart, go to [email or web address].    Your next appointment:  1 year   Call in August to schedule Dec appointment  Provider:  Dr.Jones Viviani         Signed, Little Ishikawa, MD  07/15/2023 8:33 AM    [facility]

## 2023-07-15 ENCOUNTER — Encounter: Payer: Self-pay | Admitting: Cardiology

## 2023-07-15 ENCOUNTER — Ambulatory Visit: Payer: 59 | Attending: Cardiology | Admitting: Cardiology

## 2023-07-15 VITALS — BP 114/74 | HR 96 | Ht 66.0 in | Wt 144.0 lb

## 2023-07-15 DIAGNOSIS — R000 Tachycardia, unspecified: Secondary | ICD-10-CM | POA: Diagnosis not present

## 2023-07-15 DIAGNOSIS — R0609 Other forms of dyspnea: Secondary | ICD-10-CM

## 2023-07-15 DIAGNOSIS — R002 Palpitations: Secondary | ICD-10-CM

## 2023-07-15 DIAGNOSIS — R55 Syncope and collapse: Secondary | ICD-10-CM | POA: Diagnosis not present

## 2023-07-15 DIAGNOSIS — R079 Chest pain, unspecified: Secondary | ICD-10-CM

## 2023-07-15 NOTE — Patient Instructions (Signed)
Medication Instructions:  Continue same medications *If you need a refill on your cardiac medications before your next appointment, please call your pharmacy*   Lab Work: None ordered   Testing/Procedures: Exercise Tolerance Test  first available    Follow-Up: At [facility], you and your health needs are our priority.  As part of our continuing mission to provide you with exceptional heart care, we have created designated Provider Care Teams.  These Care Teams include your primary Cardiologist (physician) and Advanced Practice Providers (APPs -  Physician Assistants and Nurse Practitioners) who all work together to provide you with the care you need, when you need it.  We recommend signing up for the patient portal called "MyChart".  Sign up information is provided on this After Visit Summary.  MyChart is used to connect with patients for Virtual Visits (Telemedicine).  Patients are able to view lab/test results, encounter notes, upcoming appointments, etc.  Non-urgent messages can be sent to your provider as well.   To learn more about what you can do with MyChart, go to [email or web address].    Your next appointment:  1 year   Call in August to schedule Dec appointment     Provider:  Dr.Schumann

## 2023-07-22 NOTE — Progress Notes (Signed)
Office Note 07/23/2023  CC:  Chief Complaint  Patient presents with   Annual Exam    HPI:  Patient is a 22 y.o. female who is here for annual health maintenance exam and f/u palpitations/dizziness. Since I last saw her she has seen cardiologist, echo and zio x 6d normal.  She feels well other than feeling short of breath and palpitations and some lightheadedness that is particularly brought on when she tries to exercise any.  Past Medical History:  Diagnosis Date   Abnormal vaginal bleeding    Dr. Aldona Bar   Acid reflux    Acne vulgaris    Headache syndrome    summer 2022   History of community acquired pneumonia 05/2021   Menometrorrhagia    Started Yaz 01/30/19   Patellofemoral arthritis    bilat 2023   Vasovagal syncope    summer 2022    Past Surgical History:  Procedure Laterality Date   KNEE ARTHROSCOPY WITH LATERAL RELEASE Left 08/05/2022   Procedure: LEFT KNEE ARTHROSCOPY, LATERAL RELEASE, MICROFRACTURE;  Surgeon: Tarry Kos, MD;  Location: [facility];  Service: Orthopedics;  Laterality: Left;   TRANSTHORACIC ECHOCARDIOGRAM     03/2023 NORMAL   Zio monitor     02/2023 NORMAL    Family History  Problem Relation Age of Onset   Miscarriages / [place] Mother    Diabetes Father    Heart attack Father    Heart disease Father    High Cholesterol Father    High blood pressure Father    Hyperlipidemia Father    Hypertension Father    Asthma Brother    Diabetes Maternal Grandmother    Heart disease Maternal Grandmother    High Cholesterol Maternal Grandmother    High blood pressure Maternal Grandmother    Miscarriages / Stillbirths Maternal Grandmother    Breast cancer Maternal Grandmother    Cancer Maternal Grandfather    COPD Maternal Grandfather    High Cholesterol Maternal Grandfather    High blood pressure Maternal Grandfather    Kidney disease Maternal Grandfather    Early death Paternal Grandfather    Heart attack Paternal  Grandfather    Heart disease Paternal Grandfather    Hyperlipidemia Sister     Social History   Socioeconomic History   Marital status: Single    Spouse name: Not on file   Number of children: 0   Years of education: Not on file   Highest education level: 12th grade  Occupational History    Comment: Data processing manager  Tobacco Use   Smoking status: Never   Smokeless tobacco: Never  Vaping Use   Vaping status: Never Used  Substance and Sexual Activity   Alcohol use: Never   Drug use: Never   Sexual activity: Yes    Birth control/protection: Condom  Other Topics Concern   Not on file  Social History Narrative   Single, no children.   Lives at home with parents as of 01/2019.   Educ: HS--[employer].   Occup: Data processing manager   No T/A/Ds   Caffeine- coffee 1 c daily   Social Drivers of Corporate investment banker Strain: Low Risk  (01/07/2023)   Overall Financial Resource Strain (CARDIA)    Difficulty of Paying Living Expenses: Not very hard  Food Insecurity: No Food Insecurity (01/07/2023)   Hunger Vital Sign    Worried About Running Out of Food in the Last Year: Never true    Ran Out of Food in the  Last Year: Never true  Transportation Needs: No Transportation Needs (01/07/2023)   PRAPARE - Administrator, Civil Service (Medical): No    Lack of Transportation (Non-Medical): No  Physical Activity: Insufficiently Active (01/07/2023)   Exercise Vital Sign    Days of Exercise per Week: 3 days    Minutes of Exercise per Session: 30 min  Stress: No Stress Concern Present (01/07/2023)   [employer] of Occupational Health - Occupational Stress Questionnaire    Feeling of Stress : Only a little  Social Connections: Moderately Isolated (01/07/2023)   Social Connection and Isolation Panel [NHANES]    Frequency of Communication with Friends and Family: Three times a week    Frequency of Social Gatherings with Friends and Family: Once a week    Attends Religious Services:  Never    Database administrator or Organizations: No    Attends Engineer, structural: Not on file    Marital Status: Living with partner  Intimate Partner Violence: Not on file    Outpatient Medications Prior to Visit  Medication Sig Dispense Refill   albuterol (VENTOLIN HFA) 108 (90 Base) MCG/ACT inhaler Inhale 2 puffs into the lungs every 6 (six) hours as needed for wheezing or shortness of breath. 8 g 0   benzonatate (TESSALON) 100 MG capsule Take 1 capsule (100 mg total) by mouth 3 (three) times daily as needed for cough. 30 capsule 0   predniSONE (DELTASONE) 20 MG tablet Take 3 tabs PO daily x 5 days. 15 tablet 0   benzoyl peroxide-erythromycin (BENZAMYCIN) gel Apply topically 2 (two) times daily. 23.3 g 0   pantoprazole (PROTONIX) 40 MG tablet Take 1 tablet (40 mg total) by mouth daily. 30 tablet 6   No facility-administered medications prior to visit.    No Known Allergies  Review of Systems  Constitutional:  Negative for appetite change, chills, fatigue and fever.  HENT:  Negative for congestion, dental problem, ear pain and sore throat.   Eyes:  Negative for discharge, redness and visual disturbance.  Respiratory:  Negative for cough, chest tightness, shortness of breath and wheezing.   Cardiovascular:  Positive for palpitations. Negative for chest pain and leg swelling.  Gastrointestinal:  Negative for abdominal pain, blood in stool, diarrhea, nausea and vomiting.  Genitourinary:  Negative for difficulty urinating, dysuria, flank pain, frequency, hematuria and urgency.  Musculoskeletal:  Negative for arthralgias, back pain, joint swelling, myalgias and neck stiffness.  Skin:  Negative for pallor and rash.  Neurological:  Positive for dizziness. Negative for speech difficulty, weakness and headaches.  Hematological:  Negative for adenopathy. Does not bruise/bleed easily.  Psychiatric/Behavioral:  Negative for confusion and sleep disturbance. The patient is not  nervous/anxious.     PE;    07/23/2023    2:14 PM 07/15/2023    8:09 AM 06/25/2023    8:15 AM  Vitals with BMI  Height 5' 6.535" 5\' 6"    Weight 144 lbs 3 oz 144 lbs   BMI 22.9 23.25   Systolic 121 114 846  Diastolic 80 74 92  Pulse 89 96 118  Exam chaperoned by Harlene Salts, CMA  Gen: Alert, well appearing.  Patient is oriented to person, place, time, and situation. AFFECT: pleasant, lucid thought and speech. ENT: Ears: EACs clear, normal epithelium.  TMs with good light reflex and landmarks bilaterally.  Eyes: no injection, icteris, swelling, or exudate.  EOMI, PERRLA. Nose: no drainage or turbinate edema/swelling.  No injection or focal lesion.  Mouth: lips without lesion/swelling.  Oral mucosa pink and moist.  Dentition intact and without obvious caries or gingival swelling.  Oropharynx without erythema, exudate, or swelling.  Neck: supple/nontender.  No LAD, mass, or TM.  Carotid pulses 2+ bilaterally, without bruits. CV: RRR, no m/r/g.   LUNGS: CTA bilat, nonlabored resps, good aeration in all lung fields. ABD: soft, NT, ND, BS normal.  No hepatospenomegaly or mass.  No bruits. EXT: no clubbing, cyanosis, or edema.  Musculoskeletal: no joint swelling, erythema, warmth, or tenderness.  ROM of all joints intact. Skin - no sores or suspicious lesions or rashes or color changes  Pertinent labs:  Lab Results  Component Value Date   TSH 2.03 01/07/2023   Lab Results  Component Value Date   WBC 9.5 01/07/2023   HGB 14.1 01/07/2023   HCT 42.6 01/07/2023   MCV 90.4 01/07/2023   PLT 285.0 01/07/2023   Lab Results  Component Value Date   CREATININE 0.74 01/07/2023   BUN 11 01/07/2023   NA 137 01/07/2023   K 3.9 01/07/2023   CL 102 01/07/2023   CO2 25 01/07/2023   Lab Results  Component Value Date   ALT 31 01/07/2023   AST 28 01/07/2023   ALKPHOS 77 01/07/2023   BILITOT 0.5 01/07/2023   Lab Results  Component Value Date   CHOL 182 01/23/2022   Lab Results   Component Value Date   HDL 52 01/23/2022   Lab Results  Component Value Date   LDLCALC 108 (H) 01/23/2022   Lab Results  Component Value Date   TRIG 110 01/23/2022   Lab Results  Component Value Date   CHOLHDL 3.5 01/23/2022   ASSESSMENT AND PLAN:   No problem-specific Assessment & Plan notes found for this encounter.  #1 health maintenance exam: Reviewed age and gender appropriate health maintenance issues (prudent diet, regular exercise, health risks of tobacco and excessive alcohol, use of seatbelts, fire alarms in home, use of sunscreen).  Also reviewed age and gender appropriate health screening as well as vaccine recommendations. Vaccines: HPV->#1 given today.  Flu-> up-to-date.  Otherwise UTD. Labs: none indicated Cervical ca screening: pt has GYN MD and will be making routine appointment soon.  #2 palpitations and dizziness, echo and zio monitoring normal. ETT planned by cardiologist.  An After Visit Summary was printed and given to the patient.  FOLLOW UP:  No follow-ups on file.  Signed:  Santiago Bumpers, MD           07/23/2023

## 2023-07-23 ENCOUNTER — Ambulatory Visit (INDEPENDENT_AMBULATORY_CARE_PROVIDER_SITE_OTHER): Payer: 59 | Admitting: Family Medicine

## 2023-07-23 ENCOUNTER — Encounter: Payer: Self-pay | Admitting: Family Medicine

## 2023-07-23 VITALS — BP 121/80 | HR 89 | Temp 98.8°F | Ht 66.54 in | Wt 144.2 lb

## 2023-07-23 DIAGNOSIS — Z23 Encounter for immunization: Secondary | ICD-10-CM

## 2023-07-23 DIAGNOSIS — Z0000 Encounter for general adult medical examination without abnormal findings: Secondary | ICD-10-CM

## 2023-08-18 ENCOUNTER — Ambulatory Visit: Payer: 59

## 2023-08-23 ENCOUNTER — Ambulatory Visit (INDEPENDENT_AMBULATORY_CARE_PROVIDER_SITE_OTHER): Payer: 59

## 2023-08-23 DIAGNOSIS — Z23 Encounter for immunization: Secondary | ICD-10-CM

## 2023-09-01 DIAGNOSIS — Z124 Encounter for screening for malignant neoplasm of cervix: Secondary | ICD-10-CM | POA: Diagnosis not present

## 2023-09-01 DIAGNOSIS — Z01419 Encounter for gynecological examination (general) (routine) without abnormal findings: Secondary | ICD-10-CM | POA: Diagnosis not present

## 2023-09-01 DIAGNOSIS — Z113 Encounter for screening for infections with a predominantly sexual mode of transmission: Secondary | ICD-10-CM | POA: Diagnosis not present

## 2023-09-01 LAB — HM PAP SMEAR: Pap: NEGATIVE

## 2023-09-07 ENCOUNTER — Ambulatory Visit: Payer: 59 | Attending: Cardiovascular Disease

## 2023-09-07 DIAGNOSIS — R079 Chest pain, unspecified: Secondary | ICD-10-CM | POA: Diagnosis not present

## 2023-09-07 LAB — EXERCISE TOLERANCE TEST
Angina Index: 2
Duke Treadmill Score: 4
Estimated workload: 13.4
Exercise duration (min): 11 min
Exercise duration (sec): 39 s
MPHR: 196 {beats}/min
Peak HR: 187 {beats}/min
Percent HR: 95 %
RPE: 17
Rest HR: 86 {beats}/min
ST Depression (mm): 0 mm

## 2023-10-02 ENCOUNTER — Other Ambulatory Visit: Payer: Self-pay

## 2023-10-02 ENCOUNTER — Ambulatory Visit
Admission: EM | Admit: 2023-10-02 | Discharge: 2023-10-02 | Disposition: A | Discharge status: Home / Self Care | Attending: Family Medicine | Admitting: Family Medicine

## 2023-10-02 DIAGNOSIS — K122 Cellulitis and abscess of mouth: Secondary | ICD-10-CM

## 2023-10-02 DIAGNOSIS — J029 Acute pharyngitis, unspecified: Secondary | ICD-10-CM

## 2023-10-02 LAB — POCT RAPID STREP A (OFFICE): Rapid Strep A Screen: NEGATIVE

## 2023-10-02 MED ORDER — PREDNISONE 20 MG PO TABS: ORAL_TABLET | ORAL | 0 refills | Status: DC

## 2023-10-02 MED ORDER — AMOXICILLIN-POT CLAVULANATE 875-125 MG PO TABS: 1.0000 | ORAL_TABLET | Freq: Two times a day (BID) | ORAL | 0 refills | Status: DC

## 2023-10-02 NOTE — ED Provider Notes (Signed)
 [facility]    CSN: [id number] Arrival date & time: 10/02/23  1022      History   Chief Complaint Chief Complaint  Patient presents with   Sore Throat   Nausea    HPI Heidi Mccormick is a 23 y.o. female.   HPI 23 year old female presents with sore throat and nausea since yesterday.  PMH significant for vasovagal syncope, polyarthralgia, and chronic low back pain.  Patient is accompanied by her husband today.  Past Medical History:  Diagnosis Date   Abnormal vaginal bleeding    Dr. Aldona Bar   Acid reflux    Acne vulgaris    Headache syndrome    summer 2022   History of community acquired pneumonia 05/2021   Menometrorrhagia    Started Yaz 01/30/19   Patellofemoral arthritis    bilat 2023   Vasovagal syncope    summer 2022    Patient Active Problem List   Diagnosis Date Noted   Polyarthralgia 07/17/2022   Neck and shoulder pain, left 05/15/2022   Chronic low back pain 05/15/2022   Patellofemoral arthritis 11/10/2021   Migraine 05/29/2021    Past Surgical History:  Procedure Laterality Date   KNEE ARTHROSCOPY WITH LATERAL RELEASE Left 08/05/2022   Procedure: LEFT KNEE ARTHROSCOPY, LATERAL RELEASE, MICROFRACTURE;  Surgeon: Tarry Kos, MD;  Location: [facility];  Service: Orthopedics;  Laterality: Left;   TRANSTHORACIC ECHOCARDIOGRAM     03/2023 NORMAL   Zio monitor     02/2023 NORMAL    OB History     Gravida  0   Para  0   Term  0   Preterm  0   AB  0   Living  0      SAB  0   IAB  0   Ectopic  0   Multiple  0   Live Births  0            Home Medications    Prior to Admission medications   Medication Sig Start Date End Date Taking? Authorizing Provider  amoxicillin-clavulanate (AUGMENTIN) 875-125 MG tablet Take 1 tablet by mouth every 12 (twelve) hours. 10/02/23  Yes Trevor Iha, FNP  predniSONE (DELTASONE) 20 MG tablet Take 3 tabs PO daily x 5 days. 10/02/23  Yes Trevor Iha, FNP    Family  History Family History  Problem Relation Age of Onset   Miscarriages / Stillbirths Mother    Diabetes Father    Heart attack Father    Heart disease Father    High Cholesterol Father    High blood pressure Father    Hyperlipidemia Father    Hypertension Father    Asthma Brother    Diabetes Maternal Grandmother    Heart disease Maternal Grandmother    High Cholesterol Maternal Grandmother    High blood pressure Maternal Grandmother    Miscarriages / Stillbirths Maternal Grandmother    Breast cancer Maternal Grandmother    Cancer Maternal Grandfather    COPD Maternal Grandfather    High Cholesterol Maternal Grandfather    High blood pressure Maternal Grandfather    Kidney disease Maternal Grandfather    Early death Paternal Grandfather    Heart attack Paternal Grandfather    Heart disease Paternal Grandfather    Hyperlipidemia Sister     Social History Social History   Tobacco Use   Smoking status: Never   Smokeless tobacco: Never  Vaping Use   Vaping status: Never Used  Substance Use  Topics   Alcohol use: Never   Drug use: Never     Allergies   Patient has no known allergies.   Review of Systems Review of Systems  HENT:  Positive for sore throat.   Gastrointestinal:  Positive for nausea.  All other systems reviewed and are negative.    Physical Exam Triage Vital Signs ED Triage Vitals  Encounter Vitals Group     BP      Systolic BP Percentile      Diastolic BP Percentile      Pulse      Resp      Temp      Temp src      SpO2      Weight      Height      Head Circumference      Peak Flow      Pain Score      Pain Loc      Pain Education      Exclude from Growth Chart    No data found.  Updated Vital Signs BP 125/86 (BP Location: Right Arm)   Pulse 88   Temp 98.1 F (36.7 C) (Oral)   LMP 09/14/2023   SpO2 99%    Physical Exam Vitals and nursing note reviewed.  Constitutional:      Appearance: Normal appearance. She is normal  weight.  HENT:     Head: Normocephalic and atraumatic.     Right Ear: Tympanic membrane, ear canal and external ear normal.     Left Ear: Tympanic membrane, ear canal and external ear normal.     Mouth/Throat:     Mouth: Mucous membranes are moist.     Pharynx: Oropharynx is clear. Uvula midline. Posterior oropharyngeal erythema and uvula swelling present.  Eyes:     Extraocular Movements: Extraocular movements intact.     Conjunctiva/sclera: Conjunctivae normal.     Pupils: Pupils are equal, round, and reactive to light.  Cardiovascular:     Rate and Rhythm: Normal rate and regular rhythm.     Pulses: Normal pulses.     Heart sounds: Normal heart sounds.  Pulmonary:     Effort: Pulmonary effort is normal.     Breath sounds: Normal breath sounds. No wheezing, rhonchi or rales.  Musculoskeletal:        General: Normal range of motion.     Cervical back: Normal range of motion and neck supple.  Skin:    General: Skin is warm and dry.  Neurological:     General: No focal deficit present.     Mental Status: She is alert and oriented to person, place, and time. Mental status is at baseline.  Psychiatric:        Mood and Affect: Mood normal.        Behavior: Behavior normal.      UC Treatments / Results  Labs (all labs ordered are listed, but only abnormal results are displayed) Labs Reviewed  POCT RAPID STREP A (OFFICE)    EKG   Radiology No results found.  Procedures Procedures (including critical care time)  Medications Ordered in UC Medications - No data to display  Initial Impression / Assessment and Plan / UC Course  I have reviewed the triage vital signs and the nursing notes.  Pertinent labs & imaging results that were available during my care of the patient were reviewed by me and considered in my medical decision making (see chart for details).  MDM: 1.  Pharyngitis, unspecified etiology-Rx'd Augmentin 875/125 mg tablet: Take 1 tablet twice daily x 7  days, Rx'd prednisone 20 mg tablet: Take 3 tabs p.o. daily x 5 days; 2.  Uvulitis-same as 1.  Rx'd Augmentin 875/125 mg tablet: Take 1 tablet twice daily x 7 days. Advised patient to take medication as directed with food to completion.  Advised patient to take prednisone with first dose of Augmentin for the next 5 of 7 days.  Encouraged increase daily water intake to 64 ounces per day while taking these medications.  Advised if symptoms worsen and/or unresolved please follow-up with PCP or here for further evaluation.  Patient discharged home, hemodynamically stable. Final Clinical Impressions(s) / UC Diagnoses   Final diagnoses:  Pharyngitis, unspecified etiology  Uvulitis     Discharge Instructions      Advised patient to take medication as directed with food to completion.  Advised patient to take prednisone with first dose of Augmentin for the next 5 of 7 days.  Encouraged increase daily water intake to 64 ounces per day while taking these medications.  Advised if symptoms worsen and/or unresolved please follow-up with PCP or here for further evaluation.     ED Prescriptions     Medication Sig Dispense Auth. Provider   amoxicillin-clavulanate (AUGMENTIN) 875-125 MG tablet Take 1 tablet by mouth every 12 (twelve) hours. 14 tablet Trevor Iha, FNP   predniSONE (DELTASONE) 20 MG tablet Take 3 tabs PO daily x 5 days. 15 tablet Trevor Iha, FNP      PDMP not reviewed this encounter.   Trevor Iha, FNP 10/02/23 1222

## 2023-10-02 NOTE — ED Triage Notes (Signed)
 Pt c/o sore throat and nausea since yesterday. No known fever. Ibuprofen prn.

## 2023-10-02 NOTE — Discharge Instructions (Addendum)
 Advised patient to take medication as directed with food to completion.  Advised patient to take prednisone with first dose of Augmentin for the next 5 of 7 days.  Encouraged increase daily water intake to 64 ounces per day while taking these medications.  Advised if symptoms worsen and/or unresolved please follow-up with PCP or here for further evaluation.

## 2023-10-14 DIAGNOSIS — Z3491 Encounter for supervision of normal pregnancy, unspecified, first trimester: Secondary | ICD-10-CM | POA: Diagnosis not present

## 2023-11-16 ENCOUNTER — Encounter: Payer: Self-pay | Admitting: Family Medicine

## 2023-11-16 ENCOUNTER — Other Ambulatory Visit (HOSPITAL_COMMUNITY): Payer: Self-pay

## 2023-11-16 MED ORDER — NORETHINDRONE 0.35 MG PO TABS
1.0000 | ORAL_TABLET | Freq: Every day | ORAL | 3 refills | Status: AC
  Filled 2023-11-16: qty 84, 84d supply, fill #0
  Filled 2024-02-16: qty 84, 84d supply, fill #1
  Filled 2024-05-14: qty 84, 84d supply, fill #2
  Filled 2024-08-06: qty 84, 84d supply, fill #3

## 2023-11-18 ENCOUNTER — Other Ambulatory Visit (HOSPITAL_BASED_OUTPATIENT_CLINIC_OR_DEPARTMENT_OTHER): Payer: Self-pay

## 2023-11-18 ENCOUNTER — Encounter: Payer: Self-pay | Admitting: Family Medicine

## 2023-11-18 ENCOUNTER — Ambulatory Visit: Admitting: Family Medicine

## 2023-11-18 VITALS — BP 138/80 | HR 91 | Temp 99.2°F | Ht 66.54 in | Wt 152.0 lb

## 2023-11-18 DIAGNOSIS — F411 Generalized anxiety disorder: Secondary | ICD-10-CM

## 2023-11-18 DIAGNOSIS — F321 Major depressive disorder, single episode, moderate: Secondary | ICD-10-CM | POA: Diagnosis not present

## 2023-11-18 MED ORDER — ALPRAZOLAM 0.5 MG PO TABS
0.5000 mg | ORAL_TABLET | Freq: Two times a day (BID) | ORAL | 0 refills | Status: AC | PRN
  Filled 2023-11-18: qty 30, 15d supply, fill #0

## 2023-11-18 MED ORDER — FLUOXETINE HCL 20 MG PO CAPS
20.0000 mg | ORAL_CAPSULE | Freq: Every day | ORAL | 0 refills | Status: DC
  Filled 2023-11-18: qty 30, 30d supply, fill #0

## 2023-11-18 NOTE — Progress Notes (Signed)
 OFFICE VISIT  11/18/2023  CC:  Chief Complaint  Patient presents with   Anxiety    Worsened in the last 6 months, would like to discuss starting medication    Patient is a 23 y.o. female who presents for anxiety.  HPI: Gradually progressive irritability, worry, feeling overwhelmed.  Has been going on for months now.  Several things in life are pressing on her: She and her fianc are anxious to get married and start a family.  She is frustrated, though, because her family is against the marriage.  She is estranged from her brother lately.  She feels financial strain, frustrated because she is unable to afford at home. She is living a long way from her job.  She struggles with acne and hates the way she looks.  She has a dog that irritates her. She feels like crying easily.  She gets home from work in the afternoon and sleeps until her husband gets home in the evening. She does not drink or take any drugs.  Fluoxetine  has helped significantly for her in the past.  ROS--> no fevers, no CP, no SOB, no dizziness, no HAs, no rashes, no melena/hematochezia.  No polyuria or polydipsia.  No myalgias or arthralgias.  No focal weakness, paresthesias, or tremors.  No acute vision or hearing abnormalities.  No n/v/d or abd pain.  No palpitations.     Past Medical History:  Diagnosis Date   Abnormal vaginal bleeding    Dr. Shelvy Dickens   Acid reflux    Acne vulgaris    Headache syndrome    summer 2022   History of community acquired pneumonia 05/2021   Menometrorrhagia    Started Yaz 01/30/19   Patellofemoral arthritis    bilat 2023   Vasovagal syncope    summer 2022    Past Surgical History:  Procedure Laterality Date   KNEE ARTHROSCOPY WITH LATERAL RELEASE Left 08/05/2022   Procedure: LEFT KNEE ARTHROSCOPY, LATERAL RELEASE, MICROFRACTURE;  Surgeon: Wes Hamman, MD;  Location: [facility];  Service: Orthopedics;  Laterality: Left;   TRANSTHORACIC ECHOCARDIOGRAM     03/2023 NORMAL    Zio monitor     02/2023 NORMAL    Outpatient Medications Prior to Visit  Medication Sig Dispense Refill   norethindrone  (MICRONOR ) 0.35 MG tablet Take 1 tablet (0.35 mg total) by mouth daily. 84 tablet 3   amoxicillin -clavulanate (AUGMENTIN ) 875-125 MG tablet Take 1 tablet by mouth every 12 (twelve) hours. 14 tablet 0   predniSONE  (DELTASONE ) 20 MG tablet Take 3 tabs PO daily x 5 days. 15 tablet 0   No facility-administered medications prior to visit.    No Known Allergies  Review of Systems  As per HPI  PE:    11/18/2023    4:03 PM 10/02/2023   11:36 AM 07/23/2023    2:14 PM  Vitals with BMI  Height 5' 6.54"  5' 6.535"  Weight 152 lbs  144 lbs 3 oz  BMI 24.14  22.9  Systolic 138 125 161  Diastolic 80 86 80  Pulse 91 88 89     Physical Exam  Gen: Alert, well appearing.  Patient is oriented to person, place, time, and situation. AFFECT: Anxious, pleasant, lucid thought and speech. She is tearful through most of the visit. No further exam today.  LABS:  none  IMPRESSION AND PLAN:  Generalized anxiety disorder and major depressive disorder.. Recommended counseling and she was agreeable--ordered behavioral health referral today. Start fluoxetine  20 mg a  day, #30, no refill. Start alprazolam  0.5 mg, 1 twice daily as needed for severe anxiety, #30, no refill.  An After Visit Summary was printed and given to the patient.  FOLLOW UP: Return in about 3 weeks (around 12/09/2023) for f/u anx/dep.  Signed:  Arletha Lady, MD           11/18/2023

## 2023-12-09 ENCOUNTER — Ambulatory Visit: Admitting: Family Medicine

## 2023-12-09 ENCOUNTER — Encounter: Payer: Self-pay | Admitting: Family Medicine

## 2023-12-09 ENCOUNTER — Other Ambulatory Visit (HOSPITAL_BASED_OUTPATIENT_CLINIC_OR_DEPARTMENT_OTHER): Payer: Self-pay

## 2023-12-09 VITALS — BP 100/60 | HR 95 | Temp 98.7°F | Ht 66.54 in | Wt 150.2 lb

## 2023-12-09 DIAGNOSIS — F325 Major depressive disorder, single episode, in full remission: Secondary | ICD-10-CM

## 2023-12-09 DIAGNOSIS — F411 Generalized anxiety disorder: Secondary | ICD-10-CM | POA: Diagnosis not present

## 2023-12-09 MED ORDER — FLUOXETINE HCL 20 MG PO CAPS
20.0000 mg | ORAL_CAPSULE | Freq: Every day | ORAL | 1 refills | Status: DC
  Filled 2023-12-09 – 2023-12-14 (×2): qty 90, 90d supply, fill #0
  Filled 2024-03-12: qty 90, 90d supply, fill #1

## 2023-12-09 NOTE — Progress Notes (Signed)
 OFFICE VISIT  12/09/2023  CC:  Chief Complaint  Patient presents with   Anxiety   Depression    Follow up; no medication concerns    Patient is a 23 y.o. female who presents for 3 wks fu anx/dep. A/P as of last visit: "Generalized anxiety disorder and major depressive disorder.. Recommended counseling and she was agreeable--ordered behavioral health referral today. Start fluoxetine  20 mg a day, #30, no refill. Start alprazolam  0.5 mg, 1 twice daily as needed for severe anxiety, #30, no refill."  INTERIM HX: She feels much better. Less emotional lability.  More motivation.  Much less anhedonia.  She says she has had a couple of opportunities to use the alprazolam  and it helped well. She does feel excessive heat/sweating in the middle of the night and in the morning.  PMP AWARE reviewed today: most recent rx for alprazolam  was filled 11/18/23, # 30, rx by me. No red flags.   Past Medical History:  Diagnosis Date   Abnormal vaginal bleeding    Dr. Shelvy Dickens   Acid reflux    Acne vulgaris    Headache syndrome    summer 2022   History of community acquired pneumonia 05/2021   Menometrorrhagia    Started Yaz 01/30/19   Patellofemoral arthritis    bilat 2023   Vasovagal syncope    summer 2022    Past Surgical History:  Procedure Laterality Date   KNEE ARTHROSCOPY WITH LATERAL RELEASE Left 08/05/2022   Procedure: LEFT KNEE ARTHROSCOPY, LATERAL RELEASE, MICROFRACTURE;  Surgeon: Wes Hamman, MD;  Location: [facility];  Service: Orthopedics;  Laterality: Left;   TRANSTHORACIC ECHOCARDIOGRAM     03/2023 NORMAL   Zio monitor     02/2023 NORMAL    Outpatient Medications Prior to Visit  Medication Sig Dispense Refill   ALPRAZolam  (XANAX ) 0.5 MG tablet Take 1 tablet (0.5 mg total) by mouth 2 (two) times daily as needed for severe anxiety. 30 tablet 0   norethindrone  (MICRONOR ) 0.35 MG tablet Take 1 tablet (0.35 mg total) by mouth daily. 84 tablet 3   FLUoxetine   (PROZAC ) 20 MG capsule Take 1 capsule (20 mg total) by mouth daily. 30 capsule 0   No facility-administered medications prior to visit.    No Known Allergies  Review of Systems As per HPI  PE:    12/09/2023    3:11 PM 11/18/2023    4:03 PM 10/02/2023   11:36 AM  Vitals with BMI  Height 5' 6.54" 5' 6.54"   Weight 150 lbs 3 oz 152 lbs   BMI 23.85 24.14   Systolic 100 138 086  Diastolic 60 80 86  Pulse 95 91 88   Physical Exam  Gen: Alert, well appearing.  Patient is oriented to person, place, time, and situation. AFFECT: pleasant, lucid thought and speech.   LABS:  none  IMPRESSION AND PLAN:  #1 GAD and MDD. Great response to fluoxetine  20 mg a day.  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 Okay to continue alprazolam  0.5 mg twice daily as needed as well.  An After Visit Summary was printed and given to the patient.  FOLLOW UP: Return in about 3 months (around 03/10/2024) for Follow-up anxiety/depression.  Signed:  Arletha Lady, MD           12/09/2023

## 2023-12-13 ENCOUNTER — Ambulatory Visit (INDEPENDENT_AMBULATORY_CARE_PROVIDER_SITE_OTHER): Payer: 59 | Admitting: Dermatology

## 2023-12-13 ENCOUNTER — Encounter: Payer: Self-pay | Admitting: Dermatology

## 2023-12-13 VITALS — BP 118/81

## 2023-12-13 DIAGNOSIS — L700 Acne vulgaris: Secondary | ICD-10-CM | POA: Diagnosis not present

## 2023-12-13 DIAGNOSIS — L819 Disorder of pigmentation, unspecified: Secondary | ICD-10-CM | POA: Diagnosis not present

## 2023-12-13 DIAGNOSIS — L905 Scar conditions and fibrosis of skin: Secondary | ICD-10-CM | POA: Diagnosis not present

## 2023-12-13 NOTE — Progress Notes (Signed)
 New Patient Visit   Subjective  Heidi Mccormick is a 23 y.o. female who presents for the following: Acne Vulgaris of face, chest, back. She used benzoyl peroxide , clindamycin, and topical erythromycin  in the past. She also took doxycycline  in the past but thinks she only tool it for a couple weeks. Nothing helped in the past. She is on a birth control pill which has helped a little bit.  Heidi Mccormick presents with a long-standing history of acne that has been resistant to multiple treatments. She has tried various topical and oral medications, including benzoyl peroxide , clindamycin, erythromycin , and doxycycline , but has not used tretinoin or retinoids.  For the past two months, Heidi Mccormick has been using gentle skincare products, which have reduced skin irritation but have not significantly improved her acne. Her current acne presentation includes open and closed comedones, pustules, and inflammatory cysts, with a notable worsening along the jawline, suggesting hormonal involvement. Heidi Mccormick reports that birth control pills provided some benefit in the past, but she still experienced breakouts around her menstrual cycle.  Heidi Mccormick expresses concerns about long-term treatment options, specifically mentioning worries about acne recurrence after discontinuing medication and the need to stop treatment if she decides to have children in the future. She is currently using Vanicream HC face wash and moisturizer as part of her gentle skincare routine.     The following portions of the chart were reviewed this encounter and updated as appropriate: medications, allergies, medical history  Review of Systems:  No other skin or systemic complaints except as noted in HPI or Assessment and Plan.  Objective  Well appearing patient in no apparent distress; mood and affect are within normal limits.  Areas Examined: Face, chest and back  Relevant exam findings are noted in the Assessment and Plan.             Assessment & Plan    ACNE VULGARIS, Acne Scarring and PIPA Exam: Open comedones and inflammatory papules/pustules of face with secondary scarring and PIPA.  - Assessment: Patient has a history of acne unresponsive to various treatments including benzoyl peroxide , clindamycin, erythromycin , and doxycycline . Has not tried tretinoin or retinoids. Recent use of gentle skincare products for two months reduced irritation but did not improve acne. Current examination reveals open and closed comedones, pustules, and inflammatory cysts, particularly prominent along the jawline, indicative of hormonal acne. Previous use of birth control provided some improvement but did not fully resolve breakouts around menstrual cycles. Chronic nature and resistance to multiple treatments suggest need for more aggressive therapy.  - Plan:    Initiate isotretinoin (Accutane) therapy     - Discussed risks, benefits, and alternatives with patient     - Informed patient about potential side effects: headaches, mood changes, joint pain, abdominal pain     - Advised on necessary precautions: two forms of birth control, alcohol avoidance, no active ingredients, no blood donation, daily sunscreen use    Register patient in iPledge program    Perform urine pregnancy test today    Schedule follow-up in 28 days to initiate isotretinoin treatment    Plan for monthly monitoring visits during treatment course (expected 6-8 months)    Recommend heavy moisturizer (e.g., Vanicream) during treatment    Suggest mineral sunscreen (e.g., Neutrogena or Vanicream)   Treatment Plan:  Isotretinoin Counseling; Review and Contraception Counseling: Reviewed potential side effects of isotretinoin including xerosis, cheilitis, hepatitis, hyperlipidemia, and severe birth defects if taken by a pregnant woman.  Women on isotretinoin must  be celibate (not having sex) or required to use at least 2 birth control methods to  prevent pregnancy (unless patient is a female of non-child bearing potential).  Females of child-bearing potential must have monthly pregnancy tests while on isotretinoin and report through I-Pledge (FDA monitoring program). Reviewed reports of suicidal ideation in those with a history of depression while taking isotretinoin and reports of diagnosis of inflammatory bowl disease (IBD) while taking isotretinoin as well as the lack of evidence for a causal relationship between isotretinoin, depression and IBD. Patient advised to reach out with any questions or concerns. Patient advised not to share pills or donate blood while on treatment or for one month after completing treatment. All patient's considering Isotretinoin must read and understand and sign Isotretinoin Consent Form and be registered with I-Pledge.   Urine pregnancy test performed in office today and was negative.  Patient demonstrates comprehension and confirms she will not get pregnant.  Lot # [id number] Exp 06/14/2025  Patient signed consent forms and registered in [facility] program.   Return in about 30 days (around 01/12/2024) for Isotretinoin.  I, Eliot Guernsey, CMA, am acting as scribe for [employer], DO .   Documentation: I have reviewed the above documentation for accuracy and completeness, and I agree with the above.  Heidi Roup, DO

## 2023-12-13 NOTE — Patient Instructions (Addendum)
 Date: Mon Dec 13 2023  Dear [name],  Thank you for visiting today. Here is a summary of the key instructions:  - Start Accutane treatment for acne   - Register in [employer]   - Use two forms of birth control while on Accutane   - Avoid alcohol during treatment   - Avoid using active ingredients on skin   - Do not donate blood during treatment  - Medications:   - Continue using Vanicream HC face wash and moisturizer   - Consider using Vanicream heavy cream during Accutane treatment  - Sun Protection:   - Use sunscreen daily   - Consider using Neutrogena or Vanicream mineral sunscreen  - Follow-up:   - Return in 28 days to start Accutane treatment   - Monthly visits during Accutane treatment to monitor for side effects  - Tests:   - Urine pregnancy test today   - Monthly pregnancy tests during Accutane treatment  - Side Effects to Watch For:   - Headaches   - Mood changes   - Joint pain   - Belly pain  Please reach out if you have any questions or concerns.  Warm regards,  Dr. Louana Roup, Dermatology       Isotretinoin Counseling; Review and Contraception Counseling: Reviewed potential side effects of isotretinoin including xerosis, cheilitis, hepatitis, hyperlipidemia, and severe birth defects if taken by a pregnant woman.  Women on isotretinoin must be celibate (not having sex) or required to use at least 2 birth control methods to prevent pregnancy (unless patient is a female of non-child bearing potential).  Females of child-bearing potential must have monthly pregnancy tests while on isotretinoin and report through [employer] (FDA monitoring program). Reviewed reports of suicidal ideation in those with a history of depression while taking isotretinoin and reports of diagnosis of inflammatory bowl disease (IBD) while taking isotretinoin as well as the lack of evidence for a causal relationship between isotretinoin, depression and IBD. Patient advised to  reach out with any questions or concerns. Patient advised not to share pills or donate blood while on treatment or for one month after completing treatment. All patient's considering Isotretinoin must read and understand and sign Isotretinoin Consent Form and be registered with I-Pledge.     Important Information  Due to recent changes in healthcare laws, you may see results of your pathology and/or laboratory studies on MyChart before the doctors have had a chance to review them. We understand that in some cases there may be results that are confusing or concerning to you. Please understand that not all results are received at the same time and often the doctors may need to interpret multiple results in order to provide you with the best plan of care or course of treatment. Therefore, we ask that you please give us  2 business days to thoroughly review all your results before contacting the office for clarification. Should we see a critical lab result, you will be contacted sooner.   If You Need Anything After Your Visit  If you have any questions or concerns for your doctor, please call our main line at [phone] If no one answers, please leave a voicemail as directed and we will return your call as soon as possible. Messages left after 4 pm will be answered the following business day.   You may also send us  a message via MyChart. We typically respond to MyChart messages within 1-2 business days.  For prescription refills, please ask your pharmacy to contact  our office. Our fax number is [phone].  If you have an urgent issue when the clinic is closed that cannot wait until the next business day, you can page your doctor at the number below.    Please note that while we do our best to be available for urgent issues outside of office hours, we are not available 24/7.   If you have an urgent issue and are unable to reach us , you may choose to seek medical care at your doctor's office,  retail clinic, urgent care center, or emergency room.  If you have a medical emergency, please immediately call 911 or go to the emergency department. In the event of inclement weather, please call our main line at [phone] for an update on the status of any delays or closures.  Dermatology Medication Tips: Please keep the boxes that topical medications come in in order to help keep track of the instructions about where and how to use these. Pharmacies typically print the medication instructions only on the boxes and not directly on the medication tubes.   If your medication is too expensive, please contact our office at [phone] or send us  a message through MyChart.   We are unable to tell what your co-pay for medications will be in advance as this is different depending on your insurance coverage. However, we may be able to find a substitute medication at lower cost or fill out paperwork to get insurance to cover a needed medication.   If a prior authorization is required to get your medication covered by your insurance company, please allow us  1-2 business days to complete this process.  Drug prices often vary depending on where the prescription is filled and some pharmacies may offer cheaper prices.  The website www.goodrx.com contains coupons for medications through different pharmacies. The prices here do not account for what the cost may be with help from insurance (it may be cheaper with your insurance), but the website can give you the price if you did not use any insurance.  - You can print the associated coupon and take it with your prescription to the pharmacy.  - You may also stop by our office during regular business hours and pick up a GoodRx coupon card.  - If you need your prescription sent electronically to a different pharmacy, notify our office through [facility] or by phone at [phone]

## 2023-12-14 ENCOUNTER — Ambulatory Visit (INDEPENDENT_AMBULATORY_CARE_PROVIDER_SITE_OTHER): Admitting: Psychology

## 2023-12-14 ENCOUNTER — Other Ambulatory Visit (HOSPITAL_BASED_OUTPATIENT_CLINIC_OR_DEPARTMENT_OTHER): Payer: Self-pay

## 2023-12-14 DIAGNOSIS — F419 Anxiety disorder, unspecified: Secondary | ICD-10-CM | POA: Diagnosis not present

## 2023-12-14 NOTE — Progress Notes (Signed)
 Heidi Mccormick Initial Adult Exam  Name: Heidi Mccormick Date: 12/14/2023 MRN: [id number] DOB: [birth year] PCP: Heidi Dick, MD  Time Spent: 3:00 pm - 3:47 pm : 47 minutes   Guardian/Payee:  Self     Paperwork requested: No   Reason for Visit /Presenting Problem: "I've been going through a lot. Feeling pressured by parents to move closer, and mother is delaying patient and her fiance' getting married. Patient has had a dog since February.   Mental Status Exam: Appearance:   Well Groomed     Behavior:  Appropriate  Motor:  Normal  Speech/Language:   Normal Rate  Affect:  Appropriate  Mood:  normal  Thought process:  normal  Thought content:    WNL  Sensory/Perceptual disturbances:    WNL  Orientation:  oriented to person, place, and time/date  Attention:  Good  Concentration:  Good  Memory:  WNL  Fund of knowledge:   Good  Insight:    Good  Judgment:   Good  Impulse Control:  Good   Reported Symptoms:  Anxiety: Feeling nervous, anxious or on edge, not being able to stop or control worrying, worrying too much about different things, becoming easily annoyed or irritable. Depression: little interest or pleasure in doing things, feeling down, depressed, or hopeless, trouble falling or staying asleep, or sleeping too much, feeling tired or having little energy, Feeling bad about yourself--or that you are a failure or have let yourself or your Mccormick down.  Risk Assessment: Danger to Self:  No Self-injurious Behavior: No Danger to Others: No Duty to Warn:no Physical Aggression / Violence:No  Access to Firearms a concern: No  Gang Involvement:No  Patient / guardian was educated about steps to take if suicide or homicide risk level increases between visits: yes While future psychiatric events cannot be accurately predicted, the patient does not currently require acute inpatient psychiatric care and does not currently meet Heidi Mccormick  involuntary  commitment criteria.  Substance Abuse History: Current substance abuse: No     Caffeine: 1 c coffee, 2-3 sodas a day Tobacco: Alcohol: Substance use:  Past Psychiatric History:   No previous psychological problems have been observed Outpatient Providers:N/A History of Psych Hospitalization: No  Psychological Testing: N/a   Abuse History:  Victim of: No., Report needed: No. Victim of Neglect:No. Perpetrator of No  Witness / Exposure to Domestic Violence: No   Protective Services Involvement: No  Witness to [employer] Violence:  No   Mccormick History:  Mccormick History  Problem Relation Age of Onset   Miscarriages / Stillbirths Mother    Diabetes Father    Heart attack Father    Heart disease Father    High Cholesterol Father    High blood pressure Father    Hyperlipidemia Father    Hypertension Father    Asthma Brother    Diabetes Maternal Grandmother    Heart disease Maternal Grandmother    High Cholesterol Maternal Grandmother    High blood pressure Maternal Grandmother    Miscarriages / Stillbirths Maternal Grandmother    Breast cancer Maternal Grandmother    Cancer Maternal Grandfather    COPD Maternal Grandfather    High Cholesterol Maternal Grandfather    High blood pressure Maternal Grandfather    Kidney disease Maternal Grandfather    Early death Paternal Grandfather    Heart attack Paternal Grandfather    Heart disease Paternal Grandfather    Hyperlipidemia Sister    Living situation: the patient lives with  Heidi Mccormick, her fiance', [place]. Met [employer], and they've been together ever since. Patient is close to her sister, Heidi Mccormick, single, never married, has 2 dogs, works from home, had been employed by [employer].   Sexual Orientation: Straight  Relationship Status: engaged Name of spouse / other: Heidi Mccormick is her fiance' If a parent, number of children / ages:N/A  Support Systems: parents (have been married 30+ years) ,  sister 54 years old and single, lives with their parents, brother 59 years old single, but girlfriend of 6 months is pregnant.   Financial Stress:  No   Income/Employment/Disability: Employment, works full time as a Audiological scientist: No   Educational History: Education: some college  Religion/Sprituality/World View: Grew [employer], but not practicing  Any cultural differences that may affect / interfere with treatment:  not applicable   Recreation/Hobbies: reading  Stressors: Medication change or noncompliance  , hanging out with Heidi Mccormick  Strengths: Self Advocate  Barriers:  N/A   Legal History: Pending legal issue / charges: The patient has no significant history of legal issues. History of legal issue / charges: No  Medical History/Surgical History: not reviewed Past Medical History:  Diagnosis Date   Abnormal vaginal bleeding    Dr. Shelvy Mccormick   Acid reflux    Acne vulgaris    Headache syndrome    summer 2022   History of community acquired pneumonia 05/2021   Menometrorrhagia    Started Yaz 01/30/19   Patellofemoral arthritis    bilat 2023   Vasovagal syncope    summer 2022    Past Surgical History:  Procedure Laterality Date   KNEE ARTHROSCOPY WITH LATERAL RELEASE Left 08/05/2022   Procedure: LEFT KNEE ARTHROSCOPY, LATERAL RELEASE, MICROFRACTURE;  Surgeon: Heidi Hamman, MD;  Location: [facility];  Service: Orthopedics;  Laterality: Left;   TRANSTHORACIC ECHOCARDIOGRAM     03/2023 NORMAL   Zio monitor     02/2023 NORMAL    Medications: Current Outpatient Medications  Medication Sig Dispense Refill   ALPRAZolam  (XANAX ) 0.5 MG tablet Take 1 tablet (0.5 mg total) by mouth 2 (two) times daily as needed for severe anxiety. 30 tablet 0   FLUoxetine  (PROZAC ) 20 MG capsule Take 1 capsule (20 mg total) by mouth daily. 90 capsule 1   norethindrone  (MICRONOR ) 0.35 MG tablet Take 1 tablet (0.35 mg total) by mouth daily. 84 tablet 3   No  current facility-administered medications for this visit.    No Known Allergies  Diagnoses: Anxiety [F41.9]   Psychiatric Treatment: Yes , via PCP  Plan of Care: OPT  Narrative:  [name] participated from office with therapist and consented to treatment. We reviewed the limits of confidentiality prior to the start of the evaluation. [name] expressed understanding and agreement to proceed.   Patient is a 23 year old female who presented for an initial assessment. Patient reported the following symptoms: Anxiety: Feeling nervous, anxious or on edge, not being able to stop or control worrying, worrying too much about different things, becoming easily annoyed or irritable. Depression: little interest or pleasure in doing things, feeling down, depressed, or hopeless, trouble falling or staying asleep, or sleeping too much, feeling tired or having little energy, Feeling bad about yourself--or that you are a failure or have let yourself or your Mccormick down. Patient denied current and past suicidal ideation, homicidal ideation, and symptoms of psychosis. Patient denied current or past tobacco, alcohol or  drug abuse. Patient reported current stressors as her mother's controlling behavior. Patient identified current supports as Heidi Mccormick and her sister, Heidi Mccormick. Patient lives with Heidi Mccormick, her fiance', [place]. Met [employer], and they've been together ever since. Patient is close to her sister, Heidi Mccormick. She is single, never married, has 2 dogs, works from home, had been employed by [employer]. Patient works full time as a Designer, multimedia. Pt wks in [place] 40 min drive from her home, Pt drives 60 minutes to see parents, lv [place] drive to parents 30 minute. Patient and Heidi Mccormick have wanted to get married for quite a while, but patient's mother has continued to put up obstacles. Patient is torn between obeying her mother, and following her dream to marry [place]. It is recommended  that patient participate in individual, outpatient, psychotherapy, that works for her school schedule. Current job 6:00 -2:30 pm, June 11, school ends  A follow-up was scheduled to create a treatment plan and begin treatment. Therapist answered  and all questions during the evaluation and contact information was provided.    [name]

## 2023-12-17 ENCOUNTER — Encounter

## 2023-12-21 ENCOUNTER — Telehealth: Payer: Self-pay

## 2023-12-21 NOTE — Telephone Encounter (Signed)
 Copied from CRM [id number]. Topic: General - Other >> Dec 21, 2023 11:24 AM Sophia H wrote: Reason for CRM: Spoke with patient who wanted to follow up on scheduling her next appt with Ferman [place]. Stated provider advised she would reach out via email to get her scheduled but it has been a week and she hasn't heard anything. Looks like provider is on the [employer] side. I am unsure of how to route to that dept. specifically.  Please re route as needed or reach out to patient and let her know how to contact this provider. Ty! My chart message / phone call okay. >> Dec 21, 2023 11:39 AM CMA Rocky Cipro C wrote: Wrong office

## 2023-12-28 ENCOUNTER — Ambulatory Visit: Admitting: Psychology

## 2023-12-28 DIAGNOSIS — F419 Anxiety disorder, unspecified: Secondary | ICD-10-CM

## 2023-12-28 NOTE — Progress Notes (Unsigned)
 [facility] Counselor/Therapist Progress Note  Patient ID: Heidi Mccormick, MRN: [id number]   Date: 12/28/23  Time Spent: 6:00 pm -  6:54 pm  :  54 minutes  Treatment Type: Individual Therapy.  Reported Symptoms:  Anxiety: Feeling nervous, anxious or on edge, not being able to stop or control worrying, worrying too much about different things, becoming easily annoyed or irritable. Depression: little interest or pleasure in doing things, feeling down, depressed, or hopeless, trouble falling or staying asleep, or sleeping too much, feeling tired or having little energy, Feeling bad about yourself--or that you are a failure or have let yourself or your family down.    Mental Status Exam: Appearance:  Well Groomed     Behavior: Appropriate  Motor: Normal  Speech/Language:  Normal Rate  Affect: Appropriate  Mood: normal  Thought process: normal  Thought content:   WNL  Sensory/Perceptual disturbances:   WNL  Orientation: oriented to person, place, and time/date  Attention: Good  Concentration: Good  Memory: WNL  Fund of knowledge:  Good  Insight:   Good  Judgment:  Good  Impulse Control: Good   Risk Assessment: Danger to Self:  No Self-injurious Behavior: No Danger to Others: No Duty to Warn:no Physical Aggression / Violence:No  Access to Firearms a concern: No  Gang Involvement:No   Subjective:   Heidi Mccormick participated from home, via video and consented to treatment. Therapist participated from home office. I discussed the limitations of evaluation and management by telemedicine and the availability of in person appointments. The patient expressed understanding and agreed to proceed. Heidi Mccormick reviewed the events of the past week.   Patient continues to struggle with family dynamics, and has made plans to commit to her fiance' in July. We discussed pros and cons and the impact of her decision making.   We reviewed numerous treatment approaches including CBT,  BA, Problem Solving, and Solution focused therapy. Psych-education regarding the Heidi Mccormick's diagnosis of Anxiety Heidi Mccormick ] was provided during the session.   We discussed Heidi Mccormick's goals treatment goals which include: (wl meet in 3 weeks if not before then)  Set boundaries with family members, most difficult to set with mother, sister Heidi Mccormick App Not allowed to take/make personal calls at work except for emergencies. Not visit more than 2 times per week Don't get sucked into taking sides between families  2. Examine my guilt What do I get out of feeling guilty?  3. Prepare how to talk with family about being married. We knew you'd be happy   Heidi Mccormick provided verbal approval of the treatment plan.   Interventions: Psycho-education & Goal Setting.   Diagnosis: Anxiety [F41.9   Psychiatric Treatment: Yes , via PCP  Treatment Plan:  Client Abilities/Strengths Heidi Mccormick is bright, motivated, interested in self-care, committed to making healthy changes in her life.  Support System: parents (have been married 30+ years) , sister 33 years old and single, lives with their parents   Client Treatment Preferences OPT  Client Statement of Needs Heidi Mccormick would like to set boundaries with her family members, examine her guilt, and learn how to prepare her family for her independence.  Treatment Level Biweekly/Monthly  Symptoms   Anxiety: Feeling nervous, anxious or on edge, not being able to stop or control worrying, worrying too much about different things, becoming easily annoyed or irritable. (Status: maintained) Depression: little interest or pleasure in doing things, feeling down, depressed, or hopeless, trouble falling or staying asleep, or sleeping too  much, feeling tired or having little energy, Feeling bad about yourself--or that you are a failure or have let yourself or your family down. (Status - maintained).  Goals:   [name] experiences symptoms of anxiety.  Treatment  plan signed and available on s-drive:  Yes    Target Date: 12/13/24 Frequency: Biweekly/Monthly  Progress: 0 Modality: individual    Therapist will provide referrals for additional resources as appropriate.  Therapist will provide psycho-education regarding Heidi Mccormick's diagnosis and corresponding treatment approaches and interventions. Heidi Mccormick will support the patient's ability to achieve the goals identified. will employ CBT, BA, Problem-solving, Solution Focused, Mindfulness,  coping skills, & other evidenced-based practices will be used to promote progress towards healthy functioning to help manage decrease symptoms associated with their diagnosis.   Reduce overall level, frequency, and intensity of the feelings of depression, anxiety and panic evidenced by decreased overall symptoms from 6 to 7 days/week to 0 to 1 days/week per client report for at least 3 consecutive months. Verbally express understanding of the relationship between feelings of anxiety and it's  impact on thinking patterns and behaviors. Verbalize an understanding of the role that distorted thinking plays in creating fears, excessive worry, and ruminations.    [name] participated in the creation of the treatment plan)    Heidi Mccormick

## 2023-12-30 NOTE — Telephone Encounter (Signed)
 When I finally was able to reach patient, I scheduled an appt with her and have already seen her. Thank you again for the notification, Dr. Tobias Forth

## 2023-12-31 NOTE — Telephone Encounter (Signed)
 Noted

## 2024-01-12 ENCOUNTER — Encounter: Payer: Self-pay | Admitting: Dermatology

## 2024-01-12 ENCOUNTER — Other Ambulatory Visit: Payer: Self-pay | Admitting: Dermatology

## 2024-01-12 ENCOUNTER — Ambulatory Visit: Admitting: Dermatology

## 2024-01-12 VITALS — BP 146/94 | Wt 150.0 lb

## 2024-01-12 DIAGNOSIS — L905 Scar conditions and fibrosis of skin: Secondary | ICD-10-CM

## 2024-01-12 DIAGNOSIS — Z7189 Other specified counseling: Secondary | ICD-10-CM | POA: Diagnosis not present

## 2024-01-12 DIAGNOSIS — L700 Acne vulgaris: Secondary | ICD-10-CM

## 2024-01-12 DIAGNOSIS — L810 Postinflammatory hyperpigmentation: Secondary | ICD-10-CM | POA: Diagnosis not present

## 2024-01-12 DIAGNOSIS — Z79899 Other long term (current) drug therapy: Secondary | ICD-10-CM

## 2024-01-12 MED ORDER — ISOTRETINOIN 40 MG PO CAPS: 40.0000 mg | ORAL_CAPSULE | Freq: Every day | ORAL | 0 refills | Status: DC

## 2024-01-12 MED ORDER — ISOTRETINOIN 40 MG PO CAPS: 40.0000 mg | ORAL_CAPSULE | Freq: Every day | ORAL | 0 refills | Status: AC

## 2024-01-12 NOTE — Progress Notes (Signed)
 Isotretinoin Follow-Up Visit   Subjective  Heidi Mccormick is a 23 y.o. female who presents for the following: Isotretinoin follow-up  Week # 0   Isotretinoin F/U - 01/12/24 0800       Isotretinoin Follow Up   iPledge # [id number]    Date 01/12/24    Weight 150 lb (68 kg)    Two Forms of Birth Control Female Condom;Oral Contraceptives (w/ estrogen)         Side effects: Dry skin, dry lips  Patient is not pregnant, not seeking pregnancy, and not breastfeeding.   The following portions of the chart were reviewed this encounter and updated as appropriate: medications, allergies, medical history  Review of Systems:  No other skin or systemic complaints except as noted in HPI or Assessment and Plan.  Objective  Well appearing patient in no apparent distress; mood and affect are within normal limits.  An examination of the face, neck, chest, and back was performed and relevant findings are noted below.            Assessment & Plan   ACNE VULGARIS    ACNE VULGARIS Patient is currently on Isotretinoin requiring FDA mandated monthly evaluations and laboratory monitoring. Condition is currently not to goal (must reach target dose based on weight and also have clear skin for 2 months prior to discontinuation in order to help prevent relapse)  Exam findings: Inflammatory papules with a few resolving acne marks on the cheeks with some PIH   Week # 0 Pharmacy [facility] # [id number] Birth Control- Condom & Oral BC  Starting isotretinoin today  - Assessment:  Patient presents with inflammatory papules, a few resolving acne cysts on the cheek, and mild acne scarring with inflammatory pigment changes. Decision made to initiate isotretinoin therapy based on severity and persistence of acne. Recent test (likely pregnancy test) was negative, allowing for registration in the isotretinoin system.  - Plan:    Initiate isotretinoin 40 mg daily based on patient's  weight     - Potential increase to 60 mg if well-tolerated     - Patient to respond to email for pharmacy prescription release    Discontinue all prescription topicals    Continue gentle skin care routine: wash, moisturize, and apply sunscreen    Provide samples of moisturizer with sunscreen (Korean brand with hyaluronic acid)    Recommend mineral sunscreen for extended sun exposure activities    Monitor for side effects:     - Lip dryness (expected early)     - Skin dryness (expected months 2-3)     - Headaches, abdominal pain, joint pain, mood changes (rare)    Screen monthly for side effects and efficacy    Instruct patient to message via MyChart if experiencing purging effect     - Will prescribe prednisone  if needed (rare occurrence)    Prescription to be filled through [facility]  Urine pregnancy test performed in office today and was negative.  Patient demonstrates comprehension and confirms she will not get pregnant.   Patient confirmed in iPledge and isotretinoin sent to pharmacy.   Isotretinoin Counseling; Review and Contraception Counseling: Reviewed potential side effects of isotretinoin including xerosis, cheilitis, hepatitis, hyperlipidemia, and severe birth defects if taken by a pregnant woman.  Women on isotretinoin must  use at least 2 birth control methods to prevent pregnancy (unless patient is a female of non-child bearing potential).  Females of child-bearing potential must have monthly pregnancy tests while on  isotretinoin and report through I-Pledge (FDA monitoring program). Reviewed reports of suicidal ideation in those with a history of depression while taking isotretinoin and reports of diagnosis of inflammatory bowl disease (IBD) while taking isotretinoin as well as the lack of evidence for a causal relationship between isotretinoin, depression and IBD. Patient advised to reach out with any questions or concerns. Patient advised not to share pills or  donate blood while on treatment or for one month after completing treatment. All patient's considering Isotretinoin must read and understand and sign Isotretinoin Consent Form and be registered with I-Pledge.  Xerosis secondary to isotretinoin therapy   Cheilitis secondary to isotretinoin therapy  Long term medication management (isotretinoin)  Patient is using long term (months to years) prescription medication  to control their dermatologic condition.  These medications require periodic monitoring to evaluate for efficacy and side effects and may require periodic laboratory monitoring.  - While taking Isotretinoin and for 30 days after you finish the medication, do not get pregnant, do not share pills, do not donate blood. Isotretinoin is best absorbed when taken with a fatty meal. Isotretinoin can make you sensitive to the sun. Daily careful sun protection including sunscreen SPF 30+ when outdoors is recommended.  Follow-up in 30 days.   Documentation: I have reviewed the above documentation for accuracy and completeness, and I agree with the above.  I, Shirron Louanne Roussel, CMA, am acting as scribe for [employer], DO.   Louana Roup, DO

## 2024-01-12 NOTE — Telephone Encounter (Signed)
 This was our patient earlier.  Please send them the ipledge number.  Thanks

## 2024-01-12 NOTE — Patient Instructions (Signed)

## 2024-01-18 ENCOUNTER — Ambulatory Visit (INDEPENDENT_AMBULATORY_CARE_PROVIDER_SITE_OTHER): Admitting: Psychology

## 2024-01-18 DIAGNOSIS — F419 Anxiety disorder, unspecified: Secondary | ICD-10-CM | POA: Diagnosis not present

## 2024-01-18 NOTE — Progress Notes (Signed)
 [facility] Counselor/Therapist Progress Note  Patient ID: Heidi Mccormick, MRN: [id number]    Date: 01/18/24  Time Spent: 5:00 pm -  5:53 pm   : 53 minutes  Treatment Type: Individual Therapy.  Reported Symptoms: Anxiety: Feeling nervous, anxious or on edge, not being able to stop or control worrying, worrying too much about different things, becoming easily annoyed or irritable. (Status: maintained) Depression: little interest or pleasure in doing things, feeling down, depressed, or hopeless, trouble falling or staying asleep, or sleeping too much, feeling tired or having little energy, Feeling bad about yourself--or that you are a failure or have let yourself or your family down. (Status - maintained).    Mental Status Exam: Appearance:  Casual     Behavior: Appropriate  Motor: Normal  Speech/Language:  Normal Rate  Affect: Appropriate  Mood: normal  Thought process: normal  Thought content:   WNL  Sensory/Perceptual disturbances:   WNL  Orientation: oriented to person, place, and time/date  Attention: Good  Concentration: Good  Memory: WNL  Fund of knowledge:  Good  Insight:   Good  Judgment:  Good  Impulse Control: Good   Risk Assessment: Danger to Self:  No Self-injurious Behavior: No Danger to Others: No Duty to Warn:no Physical Aggression / Violence:No  Access to Firearms a concern: No  Gang Involvement:No   Subjective:   Heidi Mccormick participated from home, via video, and consented to treatment. I discussed the limitations of evaluation and management by telemedicine and the availability of in person appointments. The patient expressed understanding and agreed to proceed. Therapist participated from home office. Heidi Mccormick reviewed the events of the past week.   Patient was reacting to her parents attending her baptism and denying that there was a date for the wedding. Heidi Mccormick did not show up even though Heidi Mccormick was expecting her. Patient's parents then  lied about why they couldn't come over patient's house after the baptism, even though patient and Heidi Mccormick had cleaned their whole house in anticipation of their visit.  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 Patient was pleased with her professional looking nails and discussed how it positively impacts her self esteem. Patient feels ashamed by her family situation. Mother is punishing patient for her current behavior.   Interventions: Cognitive Behavioral Therapy  Diagnosis: Anxiety [F41.9]   Psychiatric Treatment: Yes , via PCP  Treatment Plan:  Client Abilities/Strengths Heidi Mccormick is bright, motivated, interested in self-care, committed to making healthy changes in her life   Support System: parents (have been married 30+ years) , sister 80 years old and single, lives with their parents   Client Treatment Preferences OPT  Client Statement of Needs Heidi Mccormick would like to set boundaries with her family members, examine her guilt, and learn how to prepare her family for her independence.    Treatment Level Biweekly/Monthly  Symptoms  Anxiety: Feeling nervous, anxious or on edge, not being able to stop or control worrying, worrying too much about different things, becoming easily annoyed or irritable. (Status: maintained) Depression: little interest or pleasure in doing things, feeling down, depressed, or hopeless, trouble falling or staying asleep, or sleeping too much, feeling tired or having little energy, Feeling bad about yourself--or that you are a failure or have  let yourself or your family down. (Status - maintained).  Goals:   Heidi Mccormick experiences symptoms of anxiety.   Target Date: 12/13/24 Frequency: Biweekly/Monthly  Progress: 0 Modality: individual    Therapist will  provide referrals for additional resources as appropriate.  Therapist will provide psycho-education regarding Heidi Mccormick's diagnosis and corresponding treatment approaches and interventions. Heidi Mccormick will support the patient's ability to achieve the goals identified. will employ CBT, BA, Problem-solving, Solution Focused, Mindfulness,  coping skills, & other evidenced-based practices will be used to promote progress towards healthy functioning to help manage decrease symptoms associated with their diagnosis.   Reduce overall level, frequency, and intensity of the feelings of depression, anxiety and panic evidenced by decreased overall symptoms from 6 to 7 days/week to 0 to 1 days/week per client report for at least 3 consecutive months. Verbally express understanding of the relationship between feelings of anxiety and their impact on thinking patterns and behaviors. Verbalize an understanding of the role that distorted thinking plays in creating fears, excessive worry, and ruminations.  Heidi Mccormick participated in the creation of the treatment plan)   Heidi Mccormick

## 2024-01-20 NOTE — Telephone Encounter (Signed)
 [name], Has this been addressed?

## 2024-01-24 ENCOUNTER — Ambulatory Visit (INDEPENDENT_AMBULATORY_CARE_PROVIDER_SITE_OTHER): Admitting: Psychology

## 2024-01-24 DIAGNOSIS — F419 Anxiety disorder, unspecified: Secondary | ICD-10-CM | POA: Diagnosis not present

## 2024-01-24 NOTE — Progress Notes (Unsigned)
 [facility] Counselor/Therapist Progress Note  Patient ID: Heidi Mccormick, MRN: [id number]    Date: 01/24/24  Time Spent: 1:00 pm - 1:53 pm  : 53  minutes  Treatment Type: Individual Therapy.  Reported Symptoms: Anxiety: Feeling nervous, anxious or on edge, not being able to stop or control worrying, worrying too much about different things, becoming easily annoyed or irritable. (Status: maintained) Depression: little interest or pleasure in doing things, feeling down, depressed, or hopeless, trouble falling or staying asleep, or sleeping too much, feeling tired or having little energy, Feeling bad about yourself--or that you are a failure or have let yourself or your family down. (Status - maintained).   Mental Status Exam: Appearance:  Casual     Behavior: Appropriate  Motor: Normal  Speech/Language:  Normal Rate  Affect: Appropriate  Mood: normal  Thought process: normal  Thought content:   WNL  Sensory/Perceptual disturbances:   WNL  Orientation: oriented to person, place, and time/date  Attention: Good  Concentration: Good  Memory: WNL  Fund of knowledge:  Good  Insight:   Good  Judgment:  Good  Impulse Control: Good   Risk Assessment: Danger to Self:  No Self-injurious Behavior: No Danger to Others: No Duty to Warn:no Physical Aggression / Violence:No  Access to Firearms a concern: No  Gang Involvement:No   Subjective:   Heidi Mccormick participated from home, via video, and consented to treatment. I discussed the limitations of evaluation and management by telemedicine and the availability of in person appointments. The patient expressed understanding and agreed to proceed.  Therapist participated from home office. Heidi Mccormick reviewed the events of the past week.   Patient reported that her paternal grandmother who is 67 years old is in the hospital with pneumonia. Patient's grandmother is being forced into a nursing home against her wishes, by patient's  aunt Lessie). Patient is keeping her exciting upcoming date in private. Patient and Keven will be going to [place] the evening of July 11. We discussed best case vs worst case scenario of seeing her family   Interventions: Cognitive Behavioral Therapy  Diagnosis:  Anxiety [F41.9]   Psychiatric Treatment: Yes , via PCP  Treatment Plan:  Client Abilities/Strengths Heidi Mccormick is bright, motivated, interested in self-care, committed to making healthy changes in her life    Support System: parents (have been married 30+ years) , sister 57 years old and single, lives with their parents   Client Treatment Preferences OPT  Client Statement of Needs Heidi Mccormick would like to  set boundaries with her family members, examine her guilt, and learn how to prepare her family for her independence.     Treatment Level Biweekly  Symptoms  Anxiety: Feeling nervous, anxious or on edge, not being able to stop or control worrying, worrying too much about different things, becoming easily annoyed or irritable. (Status: maintained) Depression: little interest or pleasure in doing things, feeling down, depressed, or hopeless, trouble falling or staying asleep, or sleeping too much, feeling tired or having little energy, Feeling bad about yourself--or that you are a failure or have let yourself or your family down. (Status - maintained).   Goals:   Heidi Mccormick experiences symptoms of anxiety   Target Date: 12/13/24 Frequency: Biweekly  Progress: 0 Modality: individual    Therapist will provide referrals for additional resources as appropriate.  Therapist will provide psycho-education regarding Heidi Mccormick's diagnosis and corresponding treatment approaches and interventions. Heidi Mccormick will support the patient's ability to achieve the goals identified. will  employ CBT, BA, Problem-solving, Solution Focused, Mindfulness,  coping skills, & other evidenced-based practices will be used to promote progress towards healthy  functioning to help manage decrease symptoms associated with their diagnosis.   Reduce overall level, frequency, and intensity of the feelings of depression, anxiety and panic evidenced by decreased overall symptoms from 6 to 7 days/week to 0 to 1 days/week per client report for at least 3 consecutive months. Verbally express understanding of the relationship between feelings of anxiety and their impact on thinking patterns and behaviors. Verbalize an understanding of the role that distorted thinking plays in creating fears, excessive worry, and ruminations.  Heidi Mccormick participated in the creation of the treatment plan)    Heidi Mccormick

## 2024-01-31 ENCOUNTER — Ambulatory Visit (INDEPENDENT_AMBULATORY_CARE_PROVIDER_SITE_OTHER): Admitting: Psychology

## 2024-01-31 DIAGNOSIS — F419 Anxiety disorder, unspecified: Secondary | ICD-10-CM

## 2024-01-31 NOTE — Progress Notes (Signed)
 [facility] Counselor/Therapist Progress Note  Patient ID: Heidi Mccormick, MRN: [id number]    Date: 01/31/24  Time Spent: 1:01 pm - 1:54 pm: 53 minutes  Treatment Type: Individual Therapy.  Reported Symptoms: Anxiety: Feeling nervous, anxious or on edge, not being able to stop or control worrying, worrying too much about different things, becoming easily annoyed or irritable. (Status: maintained) Depression: little interest or pleasure in doing things, feeling down, depressed, or hopeless, trouble falling or staying asleep, or sleeping too much, feeling tired or having little energy, Feeling bad about yourself--or that you are a failure or have let yourself or your family down. (Status - maintained).   Mental Status Exam: Appearance:  Casual     Behavior: Appropriate  Motor: Normal  Speech/Language:  Normal Rate  Affect: Appropriate  Mood: normal  Thought process: normal  Thought content:   WNL  Sensory/Perceptual disturbances:   WNL  Orientation: oriented to person, place, and time/date  Attention: Good  Concentration: Good  Memory: WNL  Fund of knowledge:  Good  Insight:   Good  Judgment:  Good  Impulse Control: Good   Risk Assessment: Danger to Self:  No Self-injurious Behavior: No Danger to Others: No Duty to Warn:no Physical Aggression / Violence:No  Access to Firearms a concern: No  Gang Involvement:No   Subjective:   Heidi Mccormick participated from home, via video, and consented to treatment. I discussed the limitations of evaluation and management by telemedicine and the availability of in person appointments. The patient expressed understanding and agreed to proceed.  Therapist participated from home office.  Heidi Mccormick reviewed the events of the past week.   Patient's wedding day is approaching and that has brought up a large number of issues for the patient. We discussed each issue that patient is dealing with, as well as coping skills for each issue.    Interventions: Cognitive Behavioral Therapy  Diagnosis:  Anxiety [F41.9]   Psychiatric Treatment: Yes , via PCP  Treatment Plan:  Client Abilities/Strengths Heidi Mccormick is bright, motivated, interested in self-care, committed to making healthy changes in her life   Support System: parents (have been married 30+ years), sister 63 years old and single, lives with their parents   Client Treatment Preferences OPT  Client Statement of Needs Heidi Mccormick would like to set boundaries with her family members, examine her guilt, and learn how to prepare her family for her independence.        Treatment Level Weekly/Biweekly  Symptoms  Anxiety: Feeling nervous, anxious or on edge, not being able to stop or control worrying, worrying too much about different things, becoming easily annoyed or irritable. (Status: maintained) Depression: little interest or pleasure in doing things, feeling down, depressed, or hopeless, trouble falling or staying asleep, or sleeping too much, feeling tired or having little energy, Feeling bad about yourself--or that you are a failure or have let yourself or your family down. (Status - maintained).   Goals:   Heidi Mccormick experiences symptoms of anxiety.   Target Date: 12/13/24 Frequency: Weekly/Biweekly  Progress: 0 Modality: individual    Therapist will provide referrals for additional resources as appropriate.  Therapist will provide psycho-education regarding Heidi Mccormick's diagnosis and corresponding treatment approaches and interventions. [name] will support the patient's ability to achieve the goals identified. will employ CBT, BA, Problem-solving, Solution Focused, Mindfulness,  coping skills, & other evidenced-based practices will be used to promote progress towards healthy functioning to help manage decrease symptoms associated with their diagnosis.  Reduce overall level, frequency, and intensity of the feelings of depression, anxiety and panic evidenced by  decreased overall symptoms from 6 to 7 days/week to 0 to 1 days/week per client report for at least 3 consecutive months. Verbally express understanding of the relationship between feelings of anxiety and their impact on thinking patterns and behaviors. Verbalize an understanding of the role that distorted thinking plays in creating fears, excessive worry, and ruminations.  Heidi Mccormick participated in the creation of the treatment plan)   [name]

## 2024-02-06 IMAGING — DX DG KNEE COMPLETE 4+V*L*
4 series · 4 of 4 positions shown · non-contrast
Comparison: None.

CLINICAL DATA: Greater than 1 year left knee pain. Retropatellar.
Right knee views for comparison.

EXAM:
LEFT KNEE - COMPLETE 4+ VIEW; RIGHT KNEE - 1-2 VIEW

[knee lat]
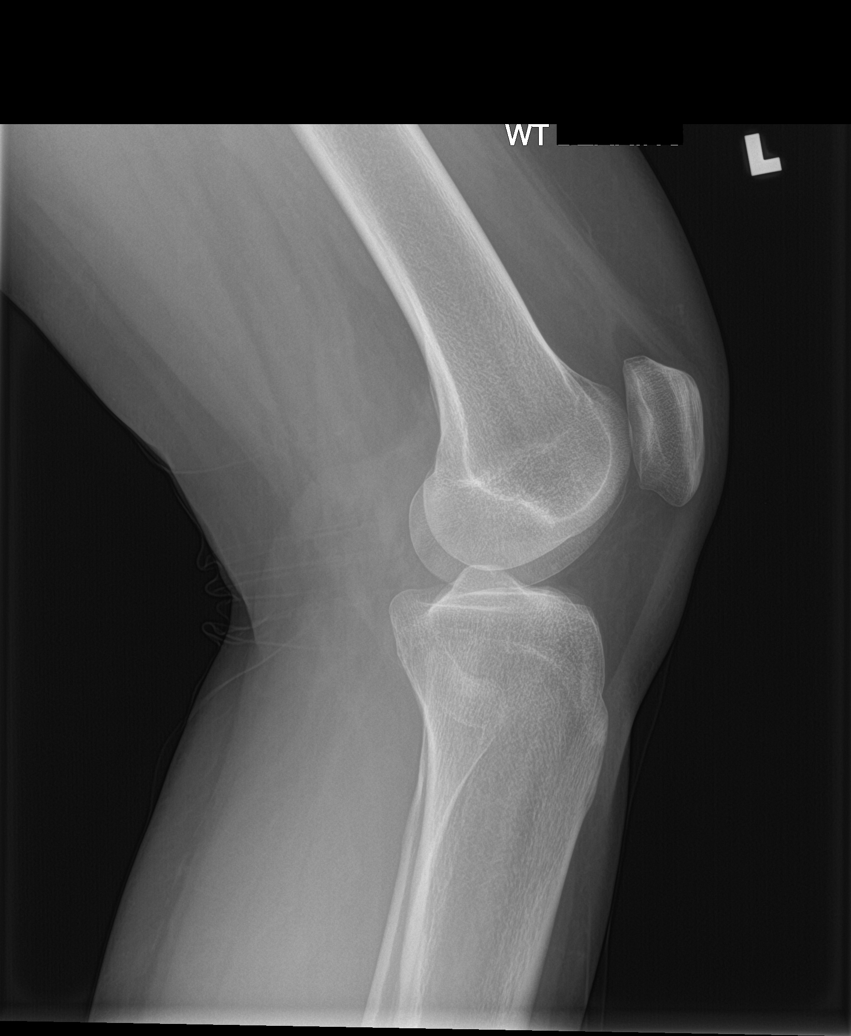

[knee ap bilat standing]
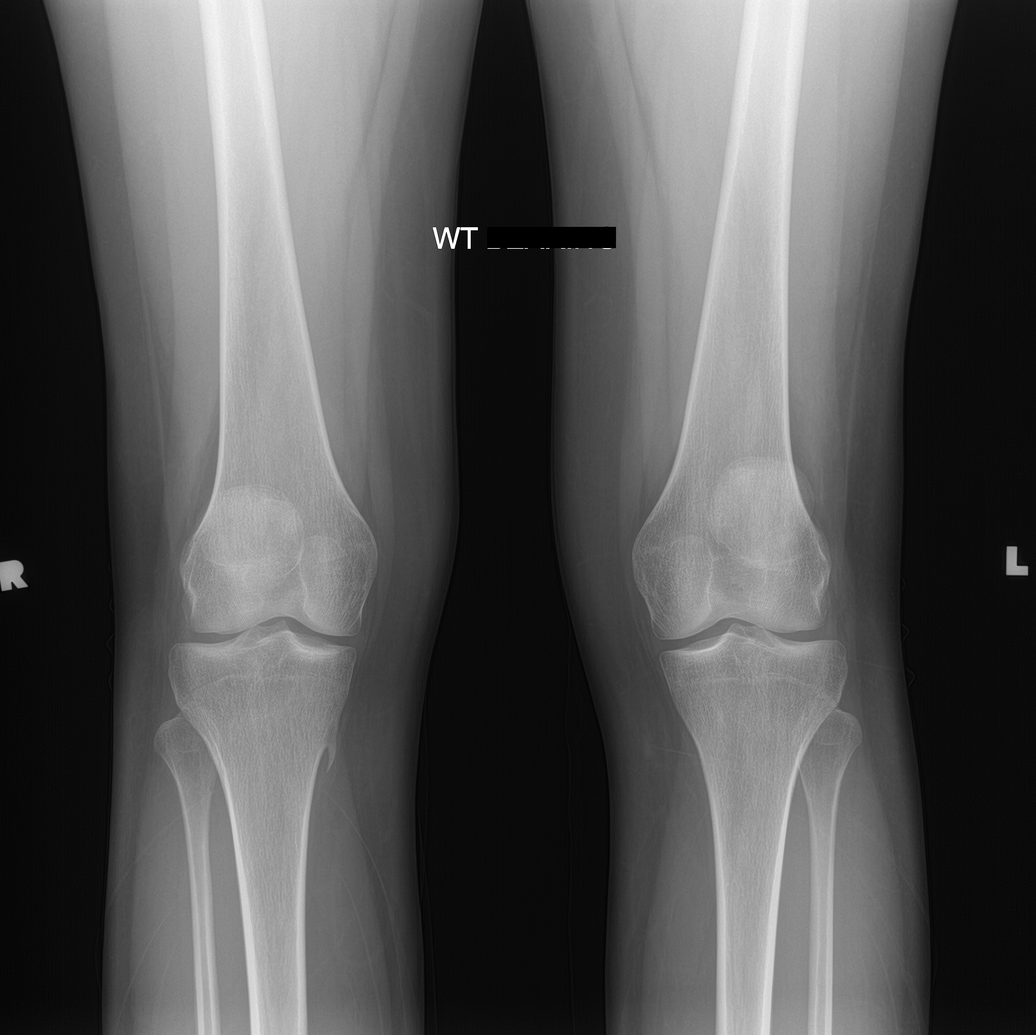

[knee sunrise standing]
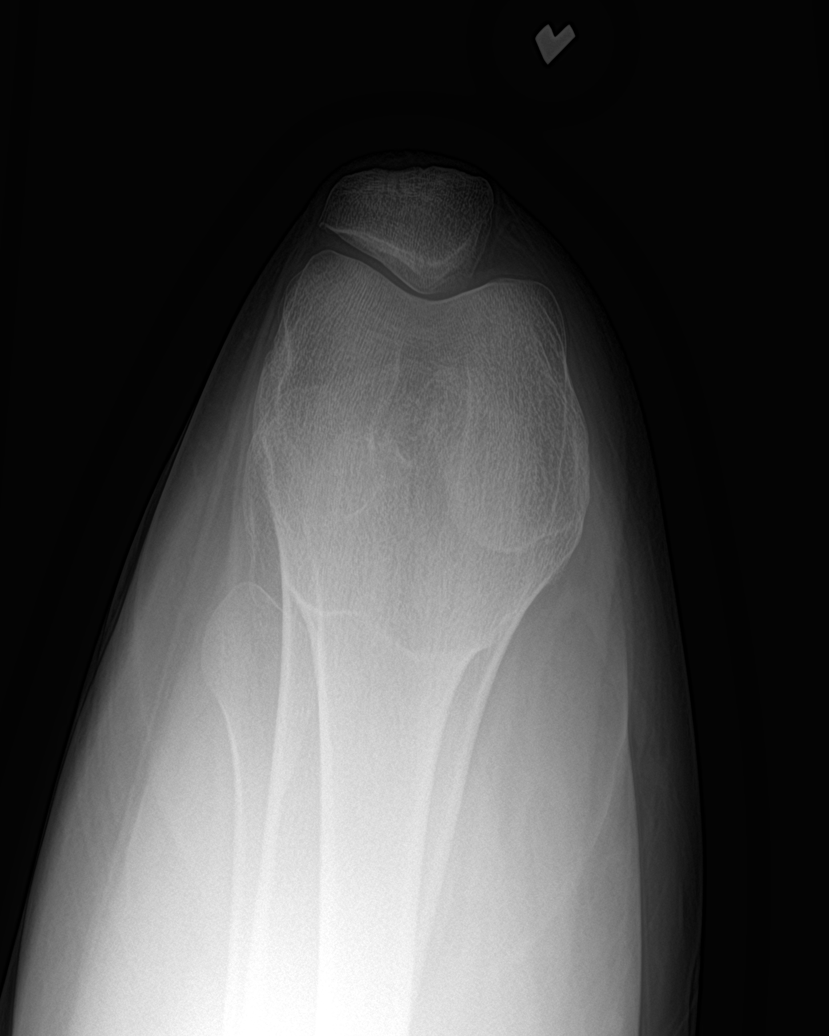

[knee tunnel]
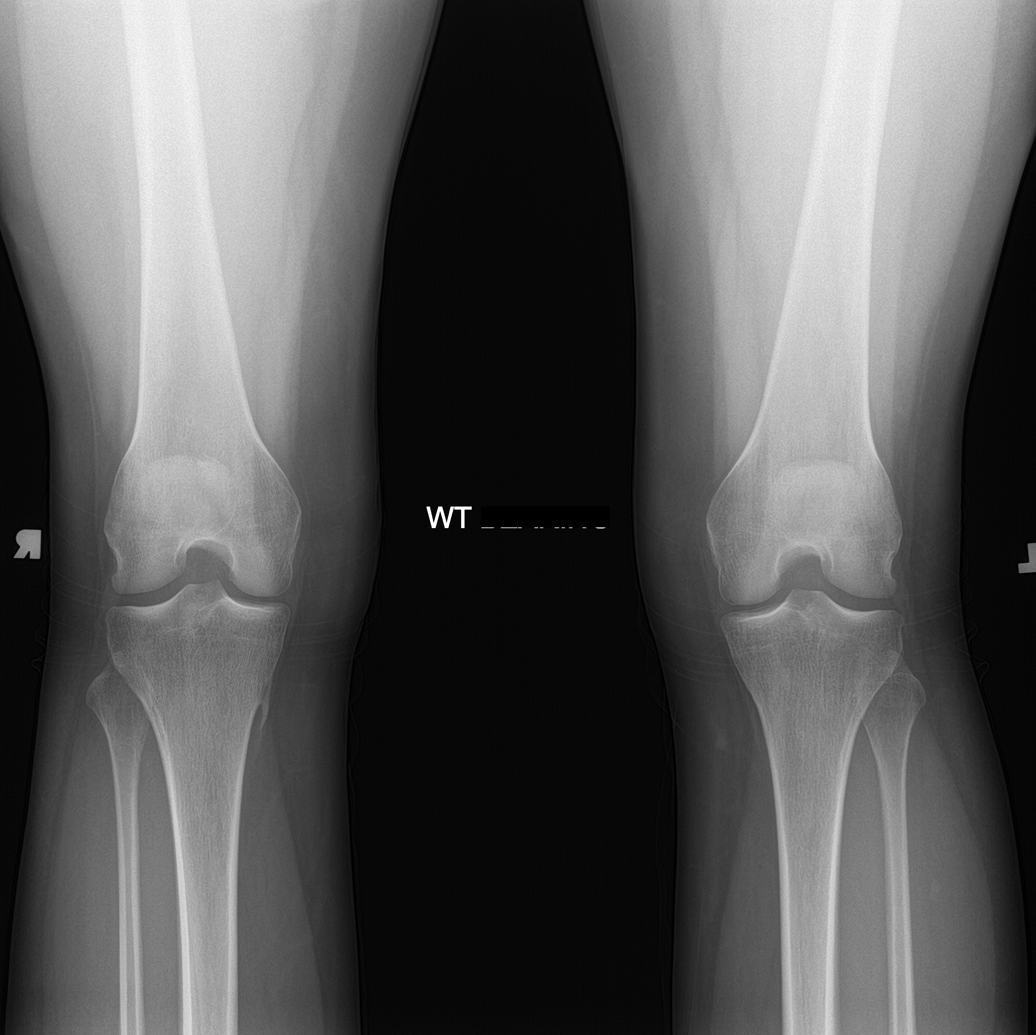

[4 of 4 positions shown; findings below may reference images not displayed]

FINDINGS: Left knee:

Normal bone mineralization. No joint effusion. Joint spaces are
preserved. No acute fracture or dislocation.

PA and AP views of the right knee demonstrates preserved medial and
lateral compartment joint spaces. There is a curvilinear bony
excrescence extending medially and inferiorly from the medial aspect
of the proximal tibial metaphysis measuring up to 13 mm in
craniocaudal dimension. This is consistent with an osteochondroma.
IMPRESSION: 1. Normal left knee radiographs.
2. Incidental note of an osteochondroma extending medially and
distally from the medial proximal tibial metaphysis.

## 2024-02-07 ENCOUNTER — Ambulatory Visit (INDEPENDENT_AMBULATORY_CARE_PROVIDER_SITE_OTHER): Admitting: Psychology

## 2024-02-07 DIAGNOSIS — F419 Anxiety disorder, unspecified: Secondary | ICD-10-CM
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
## 2024-02-10 ENCOUNTER — Ambulatory Visit: Admitting: Dermatology

## 2024-02-10 ENCOUNTER — Encounter: Payer: Self-pay | Admitting: Dermatology

## 2024-02-10 VITALS — Wt 150.0 lb

## 2024-02-10 DIAGNOSIS — Z79899 Other long term (current) drug therapy: Secondary | ICD-10-CM

## 2024-02-10 DIAGNOSIS — K130 Diseases of lips: Secondary | ICD-10-CM

## 2024-02-10 DIAGNOSIS — L700 Acne vulgaris: Secondary | ICD-10-CM

## 2024-02-10 DIAGNOSIS — T50995A Adverse effect of other drugs, medicaments and biological substances, initial encounter: Secondary | ICD-10-CM

## 2024-02-10 DIAGNOSIS — L853 Xerosis cutis: Secondary | ICD-10-CM

## 2024-02-10 MED ORDER — ISOTRETINOIN 30 MG PO CAPS: 30.0000 mg | ORAL_CAPSULE | Freq: Two times a day (BID) | ORAL | 0 refills | Status: DC

## 2024-02-10 NOTE — Progress Notes (Signed)
 Isotretinoin  Follow-Up Visit   Subjective  Heidi Mccormick is a 23 y.o. female who presents for the following: Isotretinoin  follow-up - she did have a big purge after starting but it cleared up quickly  Week #4   Isotretinoin  F/U - 02/10/24 0900       Isotretinoin  Follow Up   iPledge # [id number]    Date 02/10/24    Weight 150 lb (68 kg)    Two Forms of Birth Control Female Condom;Oral Contraceptives (w/ estrogen)      Dosage   Target Dosage (mg) 10200    Current (To Date) Dosage (mg) 1200    To Go Dosage (mg) 9000      Skin Side Effects   Dry Lips Yes    Nose bleeds No    Dry eyes No    Dry Skin No    Sunburn No      Gastrointestinal Side Effects   Nausea No    Diarrhea No    Blood in stool No      Neurological Side Effects   Blurred vision No    Depression No    Headache No    Homicidal thoughts No    Mood Changes No    Suicidal thoughts No      Constitutional Side Effects   Fatigue No      Musculoskeletal Side Effects   Muscle aches No             Side effects: Dry skin, dry lips  Patient is not pregnant, not seeking pregnancy, and not breastfeeding.   The following portions of the chart were reviewed this encounter and updated as appropriate: medications, allergies, medical history  Review of Systems:  No other skin or systemic complaints except as noted in HPI or Assessment and Plan.  Objective  Well appearing patient in no apparent distress; mood and affect are within normal limits.  An examination of the face, neck, chest, and back was performed and relevant findings are noted below.             Assessment & Plan     ACNE VULGARIS Patient is currently on Isotretinoin  requiring FDA mandated monthly evaluations and laboratory monitoring. Condition is currently not to goal (must reach target dose based on weight and also have clear skin for 2 months prior to discontinuation in order to help prevent relapse)  Exam  findings:   Week # 4 Pharmacy [facility] iPLED[facility] # [id number] Total mg -  1200 Total mg/kg - 17.65 Birth Control- Condoms and birth control pills  Increase isotretinoin  30 mg 2 capsules daily  Urine pregnancy test performed in office today and was negative.  Patient demonstrates comprehension and confirms she will not get pregnant. Lot #[id number] Exp 05/18/2025  Patient confirmed in iPledge and isotretinoin  sent to pharmacy.    Isotretinoin  Counseling; Review and Contraception Counseling: Reviewed potential side effects of isotretinoin  including xerosis, cheilitis, hepatitis, hyperlipidemia, and severe birth defects if taken by a pregnant woman.  Women on isotretinoin  must be celibate (not having sex) or required to use at least 2 birth control methods to prevent pregnancy (unless patient is a female of non-child bearing potential).  Females of child-bearing potential must have monthly pregnancy tests while on isotretinoin  and report through I-Pledge (FDA monitoring program). Reviewed reports of suicidal ideation in those with a history of depression while taking isotretinoin  and reports of diagnosis of inflammatory bowl disease (IBD) while taking isotretinoin  as well as the lack of evidence  for a causal relationship between isotretinoin , depression and IBD. Patient advised to reach out with any questions or concerns. Patient advised not to share pills or donate blood while on treatment or for one month after completing treatment. All patient's considering Isotretinoin  must read and understand and sign Isotretinoin  Consent Form and be registered with I-Pledge.   Cheilitis secondary to isotretinoin  therapy - Continue lip balm as directed, Aquaphor Body Balm recommended   Long term medication management (isotretinoin )  Patient is using long term (months to years) prescription medication  to control their dermatologic condition.  These medications require periodic monitoring to evaluate for  efficacy and side effects and may require periodic laboratory monitoring.  - While taking Isotretinoin  and for 30 days after you finish the medication, do not get pregnant, do not share pills, do not donate blood. Isotretinoin  is best absorbed when taken with a fatty meal. Isotretinoin  can make you sensitive to the sun. Daily careful sun protection including sunscreen SPF 30+ when outdoors is recommended.  Follow-up in 30 days.  I, Roseline Hutchinson, CMA, am acting as scribe for [employer], DO .   Documentation: I have reviewed the above documentation for accuracy and completeness, and I agree with the above.  Delon Lenis, DO

## 2024-02-10 NOTE — Patient Instructions (Signed)
 Acne is Severe; chronic and persistent; not at goal. Patient is on Isotretinoin -  requiring FDA mandated monthly evaluations and laboratory monitoring.  While taking Isotretinoin and for 30 days after you finish the medication, do not get pregnant, do not share pills, do not donate blood.  Generic isotretinoin is best absorbed when taken with a fatty meal. Isotretinoin can make you sensitive to the sun. Daily careful sun protection including sunscreen SPF 30+ when outdoors is recommended.     Important Information  Due to recent changes in healthcare laws, you may see results of your pathology and/or laboratory studies on MyChart before the doctors have had a chance to review them. We understand that in some cases there may be results that are confusing or concerning to you. Please understand that not all results are received at the same time and often the doctors may need to interpret multiple results in order to provide you with the best plan of care or course of treatment. Therefore, we ask that you please give [place] 2 business days to thoroughly review all your results before contacting the office for clarification. Should we see a critical lab result, you will be contacted sooner.   If You Need Anything After Your Visit  If you have any questions or concerns for your doctor, please call our main line at [phone] If no one answers, please leave a voicemail as directed and we will return your call as soon as possible. Messages left after 4 pm will be answered the following business day.   You may also send [place] a message via MyChart. We typically respond to MyChart messages within 1-2 business days.  For prescription refills, please ask your pharmacy to contact our office. Our fax number is [phone].  If you have an urgent issue when the clinic is closed that cannot wait until the next business day, you can page your doctor at the number below.    Please note that while we do our best to  be available for urgent issues outside of office hours, we are not available 24/7.   If you have an urgent issue and are unable to reach [place], you may choose to seek medical care at your doctor's office, retail clinic, urgent care center, or emergency room.  If you have a medical emergency, please immediately call 911 or go to the emergency department. In the event of inclement weather, please call our main line at [phone] for an update on the status of any delays or closures.  Dermatology Medication Tips: Please keep the boxes that topical medications come in in order to help keep track of the instructions about where and how to use these. Pharmacies typically print the medication instructions only on the boxes and not directly on the medication tubes.   If your medication is too expensive, please contact our office at [phone] or send [place] a message through MyChart.   We are unable to tell what your co-pay for medications will be in advance as this is different depending on your insurance coverage. However, we may be able to find a substitute medication at lower cost or fill out paperwork to get insurance to cover a needed medication.   If a prior authorization is required to get your medication covered by your insurance company, please allow [place] 1-2 business days to complete this process.  Drug prices often vary depending on where the prescription is filled and some pharmacies may offer cheaper prices.  The website www.goodrx.com contains  coupons for medications through different pharmacies. The prices here do not account for what the cost may be with help from insurance (it may be cheaper with your insurance), but the website can give you the price if you did not use any insurance.  - You can print the associated coupon and take it with your prescription to the pharmacy.  - You may also stop by our office during regular business hours and pick up a GoodRx coupon card.  - If you need your  prescription sent electronically to a different pharmacy, notify our office through [facility] or by phone at [phone]

## 2024-02-14 ENCOUNTER — Ambulatory Visit: Admitting: Psychology

## 2024-02-14 DIAGNOSIS — F419 Anxiety disorder, unspecified: Secondary | ICD-10-CM

## 2024-02-14 NOTE — Progress Notes (Unsigned)
 [facility] Counselor/Therapist Progress Note  Patient ID: Heidi Mccormick, MRN: [id number]    Date: 02/14/24  Time Spent: 1:00 pm - 1:53 pm : 53 minutes  Treatment Type: Individual Therapy.  Reported Symptoms:  Anxiety: Feeling nervous, anxious or on edge, not being able to stop or control worrying, worrying too much about different things, becoming easily annoyed or irritable. (Status: maintained) Depression: little interest or pleasure in doing things, feeling down, depressed, or hopeless, trouble falling or staying asleep, or sleeping too much, feeling tired or having little energy, Feeling bad about yourself--or that you are a failure or have let yourself or your family down. (Status - maintained).    Mental Status Exam: Appearance:  Casual     Behavior: Appropriate  Motor: Normal  Speech/Language:  Normal Rate  Affect: Appropriate  Mood: normal  Thought process: normal  Thought content:   WNL  Sensory/Perceptual disturbances:   WNL  Orientation: oriented to person, place, and time/date  Attention: Good  Concentration: Good  Memory: WNL  Fund of knowledge:  Good  Insight:   Good  Judgment:  Good  Impulse Control: Good   Risk Assessment: Danger to Self:  No Self-injurious Behavior: No Danger to Others: No Duty to Warn:no Physical Aggression / Violence:No  Access to Firearms a concern: No  Gang Involvement:No   Subjective:   [name] participated from home, via video, and consented to treatment. I discussed the limitations of evaluation and management by telemedicine and the availability of in person appointments. The patient expressed understanding and agreed to proceed.  Therapist participated from home office.  Nejla reviewed the events of the past week.   How quick the summer is going by, patient goes back to work on August 18. Patient will be traveling to [place], [place]  and [place] the weekend of August 8 coming home on August 10. Patient  will   Emelia, maternal first cousin, is married to [place], baby shower scheduled for sometime in August. (Written invitations)  Greggory, maternal aunt,  Pros=supporting her cousins/family- Cons=seeing pt's biological family-  Lesta, maternal first cousin, her baby is turning one year old (evites)  Pros=supporting her cousins/family Cons=seeing pt's biological family  Interventions: Cognitive Behavioral Therapy  Diagnosis:  No diagnosis found.  Psychiatric Treatment: Yes, via PCP  Treatment Plan:  Client Abilities/Strengths Evelyn is bright, motivated, interested in self-care, committed to making healthy changes in her life    Support System: Patient's inlaws mother in law and sibling inlaws  Client Treatment Preferences ***  Client Statement of Needs Catlyn would like to ***   Treatment Level {Frequency of sessions.:26745}  Symptoms  ***   (Status: {Symptom Status:26744}) ***   (Status: {Symptom Status:26744})  Goals:   Damien experiences symptoms of ***   Target Date: *** Frequency: {Frequency of sessions.:26745}  Progress: 0 Modality: individual    Therapist will provide referrals for additional resources as appropriate.  Therapist will provide psycho-education regarding Bibiana's diagnosis and corresponding treatment approaches and interventions. [name] will support the patient's ability to achieve the goals identified. will employ CBT, BA, Problem-solving, Solution Focused, Mindfulness,  coping skills, & other evidenced-based practices will be used to promote progress towards healthy functioning to help manage decrease symptoms associated with {his/her/their:21314} diagnosis.   Reduce overall level, frequency, and intensity of the feelings of depression, anxiety and panic evidenced by decreased *** from 6 to 7 days/week to 0 to 1 days/week per client report for at least 3 consecutive months. Verbally  express understanding of the relationship between feelings  of ***depression, ***anxiety and their impact on thinking patterns and behaviors. Verbalize an understanding of the role that distorted thinking plays in creating fears, excessive worry, and ruminations.  [name] in the creation of the treatment plan)   Jenkins CHRISTELLA Maudie Jenkins CHRISTELLA Maudie

## 2024-02-28 ENCOUNTER — Ambulatory Visit (INDEPENDENT_AMBULATORY_CARE_PROVIDER_SITE_OTHER): Admitting: Psychology

## 2024-02-28 DIAGNOSIS — F419 Anxiety disorder, unspecified: Secondary | ICD-10-CM | POA: Diagnosis not present
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
## 2024-03-06 ENCOUNTER — Ambulatory Visit (INDEPENDENT_AMBULATORY_CARE_PROVIDER_SITE_OTHER): Admitting: Psychology

## 2024-03-06 DIAGNOSIS — F419 Anxiety disorder, unspecified: Secondary | ICD-10-CM

## 2024-03-06 NOTE — Progress Notes (Signed)
 [facility] Counselor/Therapist Progress Note  Patient ID: Heidi Mccormick, MRN: [id number]    Date: 03/06/24  Time Spent: 11:00 am - 11:53 am : 53 minutes   Treatment Type: Individual Therapy.  Reported Symptoms:  Anxiety: Feeling nervous, anxious or on edge, not being able to stop or control worrying, worrying too much about different things, becoming easily annoyed or irritable. (Status: maintained) Depression: little interest or pleasure in doing things, feeling down, depressed, or hopeless, trouble falling or staying asleep, or sleeping too much, feeling tired or having little energy, Feeling bad about yourself--or that you are a failure or have let yourself or your family down. (Status  :maintained).     Mental Status Exam: Appearance:  Well Groomed     Behavior: Appropriate  Motor: Normal  Speech/Language:  Normal Rate  Affect: Appropriate  Mood: normal  Thought process: normal  Thought content:   WNL  Sensory/Perceptual disturbances:   WNL  Orientation: oriented to person, place, and time/date  Attention: Good  Concentration: Good  Memory: WNL  Fund of knowledge:  Good  Insight:   Good  Judgment:  Good  Impulse Control: Good   Risk Assessment: Danger to Self:  No Self-injurious Behavior: No Danger to Others: No Duty to Warn:no Physical Aggression / Violence:No  Access to Firearms a concern: No  Gang Involvement:No   Subjective:   Heidi Mccormick participated from home, via video, and consented to treatment. I discussed the limitations of evaluation and management by telemedicine and the availability of in person appointments. The patient expressed understanding and agreed to proceed.  Therapist participated from home office.  Heidi Mccormick reviewed the events of the past week.  Patient just learned on Friday, that she was due back to work today. Patient was surprised that she was due back to work so soon, but she was so looking forward to it.  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 Patient describes her next two weeks as chaotic.  Patient talked with her mother in law about dropping her maiden name. Patient is concerned that her mother and sister will be beyond angry with her, but at the same time patient felt they couldn't say anything worse than they have said already said. Patient has made progress in decreasing the amount of worrying she has been doing.   Interventions: Cognitive Behavioral Therapy  Diagnosis: Anxiety [F41.9}   Psychiatric Treatment: Yes , via PCP  Treatment Plan:  Client Abilities/Strengths Heidi Mccormick is bright, motivated, interested in self-care, committed to making healthy changes in her life   Support System: Patient's in laws and sibling in laws  Client Treatment Preferences OPT  Client Statement of Needs Heidi Mccormick would like to set boundaries with her family members, examine her guilt, and learn how to be more independent.    Treatment Level Weekly  Symptoms  Heidi Mccormick experiences symptoms of Anxiety: (Status : Improved)  Anxiety: (Status : Improved)   Goals:   Target Date: 12/22/24 Frequency: Monthly  Progress: 25% Modality: individual    Therapist will provide referrals for additional resources as appropriate.  Therapist will provide psycho-education regarding Heidi Mccormick's diagnosis and corresponding treatment approaches and interventions. Jenkins Heidi Mccormick Nicolas will support the patient's ability to achieve the goals identified. will employ CBT, BA, Problem-solving, Solution Focused, Mindfulness,  coping skills, & other evidenced-based practices will be used to promote progress towards healthy functioning to help manage decrease symptoms associated with their diagnosis.   Reduce overall level,  frequency, and intensity of the feelings of depression, anxiety and panic evidenced by decreased overall symptoms from 6 to 7 days/week to 0 to 1 days/week per client report for  at least 3 consecutive months. Verbally express understanding of the relationship between feelings of depression and anxiety and their impact on thinking patterns and behaviors. Verbalize an understanding of the role that distorted thinking plays in creating fears, excessive worry, and ruminations.  Heidi Mccormick participated in the creation of the treatment plan)    Jenkins Heidi Mccormick Nicolas

## 2024-03-09 NOTE — Patient Instructions (Incomplete)
 Heidi Mccormick

## 2024-03-14 ENCOUNTER — Ambulatory Visit: Admitting: Dermatology

## 2024-03-14 VITALS — Wt 150.0 lb

## 2024-03-14 DIAGNOSIS — Z79899 Other long term (current) drug therapy: Secondary | ICD-10-CM | POA: Diagnosis not present

## 2024-03-14 DIAGNOSIS — Z7189 Other specified counseling: Secondary | ICD-10-CM

## 2024-03-14 DIAGNOSIS — L905 Scar conditions and fibrosis of skin: Secondary | ICD-10-CM

## 2024-03-14 DIAGNOSIS — L700 Acne vulgaris: Secondary | ICD-10-CM | POA: Diagnosis not present

## 2024-03-14 DIAGNOSIS — L853 Xerosis cutis: Secondary | ICD-10-CM

## 2024-03-14 MED ORDER — ISOTRETINOIN 30 MG PO CAPS: 30.0000 mg | ORAL_CAPSULE | Freq: Two times a day (BID) | ORAL | 0 refills | Status: DC

## 2024-03-14 NOTE — Progress Notes (Signed)
 Isotretinoin  Follow-Up Visit   Subjective  Heidi Mccormick is a 23 y.o. female who presents for the following: Isotretinoin  follow-up  Week # 8 Current dose 60mg    Isotretinoin  F/U - 03/14/24 1500       Isotretinoin  Follow Up   iPledge # [id number]    Date 03/14/24    Weight 150 lb (68 kg)    Two Forms of Birth Control Female Condom;Oral Contraceptives (w/ estrogen)    Acne breakouts since last visit? Yes      Dosage   Target Dosage (mg) 10200    Current (To Date) Dosage (mg) 3000    To Go Dosage (mg) 7200      Skin Side Effects   Dry Lips Yes    Nose bleeds No    Dry eyes Yes    Dry Skin Yes    Sunburn No      Gastrointestinal Side Effects   Nausea No    Diarrhea No    Blood in stool No      Neurological Side Effects   Blurred vision Yes    Depression No    Headache No    Homicidal thoughts No    Mood Changes No    Suicidal thoughts No      Constitutional Side Effects   Fatigue No      Musculoskeletal Side Effects   Muscle aches No      Labs Notes   Last labs done 03/14/24    Lab comment negative           Side effects: Dry skin, dry lips  Patient is not pregnant, not seeking pregnancy, and not breastfeeding.   The following portions of the chart were reviewed this encounter and updated as appropriate: medications, allergies, medical history  Review of Systems:  No other skin or systemic complaints except as noted in HPI or Assessment and Plan.  Objective  Well appearing patient in no apparent distress; mood and affect are within normal limits.  An examination of the face, neck, chest, and back was performed and relevant findings are noted below.              Assessment & Plan     ACNE VULGARIS Patient is currently on Isotretinoin  requiring FDA mandated monthly evaluations and laboratory monitoring. Condition is currently not to goal (must reach target dose based on weight and also have clear skin for 2 months prior to  discontinuation in order to help prevent relapse)  Exam findings: Acne lesions are clearing, 1 inflammatory papule on left temple  Week # 8 Pharmacy [facility] # [id number] Total mg -  7200 Total mg/kg - 105 mg/kg Birth Control- condom & oral contraceptives  Continue isotretinoin    Urine pregnancy test performed in office today and was negative.  Patient demonstrates comprehension and confirms she will not get pregnant.   Patient confirmed in iPledge and isotretinoin  sent to pharmacy.   Isotretinoin  Counseling; Review and Contraception Counseling: Reviewed potential side effects of isotretinoin  including xerosis, cheilitis, hepatitis, hyperlipidemia, and severe birth defects if taken by a pregnant woman.  Women on isotretinoin  must be celibate (not having sex) or required to use at least 2 birth control methods to prevent pregnancy (unless patient is a female of non-child bearing potential).  Females of child-bearing potential must have monthly pregnancy tests while on isotretinoin  and report through I-Pledge (FDA monitoring program). Reviewed reports of suicidal ideation in those with a history of depression while taking isotretinoin  and  reports of diagnosis of inflammatory bowl disease (IBD) while taking isotretinoin  as well as the lack of evidence for a causal relationship between isotretinoin , depression and IBD. Patient advised to reach out with any questions or concerns. Patient advised not to share pills or donate blood while on treatment or for one month after completing treatment. All patient's considering Isotretinoin  must read and understand and sign Isotretinoin  Consent Form and be registered with I-Pledge.  Xerosis secondary to isotretinoin  therapy   Cheilitis secondary to isotretinoin  therapy    Long term medication management (isotretinoin )  Patient is using long term (months to years) prescription medication  to control their dermatologic condition.  These  medications require periodic monitoring to evaluate for efficacy and side effects and may require periodic laboratory monitoring.  - While taking Isotretinoin  and for 30 days after you finish the medication, do not get pregnant, do not share pills, do not donate blood. Isotretinoin  is best absorbed when taken with a fatty meal. Isotretinoin  can make you sensitive to the sun. Daily careful sun protection including sunscreen SPF 30+ when outdoors is recommended.  Follow-up in 30 days.    Documentation: I have reviewed the above documentation for accuracy and completeness, and I agree with the above.  I, Shirron Maranda, CMA, am acting as scribe for [employer], DO.   Delon Lenis, DO

## 2024-03-17 ENCOUNTER — Ambulatory Visit: Admitting: Family Medicine

## 2024-03-28 ENCOUNTER — Encounter: Payer: Self-pay | Admitting: Sports Medicine

## 2024-04-04 ENCOUNTER — Ambulatory Visit (INDEPENDENT_AMBULATORY_CARE_PROVIDER_SITE_OTHER): Admitting: Psychology

## 2024-04-04 DIAGNOSIS — F419 Anxiety disorder, unspecified: Secondary | ICD-10-CM

## 2024-04-04 NOTE — Progress Notes (Unsigned)
 [facility] Counselor/Therapist Progress Note  Patient ID: Heidi Mccormick, MRN: [id number]    Date: 04/04/24  Time Spent: 5:00 pm - 5:53 pm : 53 minutes  Treatment Type: Individual Therapy.  Reported Symptoms: Anxiety: Feeling nervous, anxious or on edge, not being able to stop or control worrying, worrying too much about different things, becoming easily annoyed or irritable. Depression: little interest or pleasure in doing things, feeling down, depressed, or hopeless, trouble falling or staying asleep, or sleeping too much, feeling tired or having little energy, Feeling bad about yourself--or that you are a failure or have let yourself or your family down.   Mental Status Exam: Appearance:  Well Groomed     Behavior: Appropriate  Motor: Normal  Speech/Language:  Normal Rate  Affect: Appropriate  Mood: normal  Thought process: normal  Thought content:   WNL  Sensory/Perceptual disturbances:   WNL  Orientation: oriented to person, place, and time/date  Attention: Good  Concentration: Good  Memory: WNL  Fund of knowledge:  Good  Insight:   Good  Judgment:  Good  Impulse Control: Good   Risk Assessment: Danger to Self:  No Self-injurious Behavior: No Danger to Others: No Duty to Warn:no Physical Aggression / Violence:No  Access to Firearms a concern: No  Gang Involvement:No   Subjective:   Heidi Mccormick participated from home, via video, and consented to treatment. I discussed the limitations of evaluation and management by telemedicine and the availability of in person appointments. The patient expressed understanding and agreed to proceed.  Therapist participated from home office.  [place] reviewed the events of the past week.   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 Patient is a godmother to [place].  Interventions: Cognitive Behavioral Therapy  Diagnosis:  Anxiety [F41.9}   Psychiatric Treatment: Yes , via PCP  Treatment Plan:  Client Abilities/Strengths Kaidyn is bright, motivated, interested in self-care, and committed to making healthy changes in her life  Support System: Patient's in laws and siblings in law  Client Treatment Preferences OPT  Client Statement of Needs [place] would like to set boundaries with her family members, examine her guilt, and learn how to be more independent.   Treatment Level Weekly  Symptoms:  Daron experiences symptoms of Anxiety (Status : improved)  Goals:   Target Date: 12/22/24 Frequency: Monthly  Progress: 50% Modality: individual    Therapist will provide referrals for additional resources as appropriate.  Therapist will provide psycho-education regarding Shamiyah's diagnosis and corresponding treatment approaches and interventions. [name] will support the patient's ability to achieve the goals identified. will employ CBT, BA, Problem-solving, Solution Focused, Mindfulness,  coping skills, & other evidenced-based practices will be used to promote progress towards healthy functioning to help manage decrease symptoms associated with their diagnosis.   Reduce overall level, frequency, and intensity of the feelings of depression, anxiety and panic evidenced by decreased overall symptoms from 6 to 7 days/week to 0 to 1 days/week per client report for at least 3 consecutive months. Verbally express understanding of the relationship between feelings of anxiety and their impact on thinking patterns and behaviors. Verbalize an understanding of the role that distorted thinking plays in creating fears, excessive worry, and ruminations.  [name] in  the creation of the treatment plan)     [name]

## 2024-04-10 ENCOUNTER — Encounter: Payer: Self-pay | Admitting: Dermatology

## 2024-04-13 ENCOUNTER — Encounter: Payer: Self-pay | Admitting: Dermatology

## 2024-04-13 ENCOUNTER — Ambulatory Visit (INDEPENDENT_AMBULATORY_CARE_PROVIDER_SITE_OTHER): Admitting: Dermatology

## 2024-04-13 VITALS — BP 98/65 | HR 95 | Wt 140.2 lb

## 2024-04-13 DIAGNOSIS — Z79899 Other long term (current) drug therapy: Secondary | ICD-10-CM | POA: Diagnosis not present

## 2024-04-13 DIAGNOSIS — L853 Xerosis cutis: Secondary | ICD-10-CM

## 2024-04-13 DIAGNOSIS — L700 Acne vulgaris: Secondary | ICD-10-CM | POA: Diagnosis not present

## 2024-04-13 DIAGNOSIS — Z7189 Other specified counseling: Secondary | ICD-10-CM

## 2024-04-13 DIAGNOSIS — K130 Diseases of lips: Secondary | ICD-10-CM | POA: Diagnosis not present

## 2024-04-13 MED ORDER — ISOTRETINOIN 30 MG PO CAPS
30.0000 mg | ORAL_CAPSULE | Freq: Two times a day (BID) | ORAL | 0 refills | Status: DC
Start: 2024-04-13 — End: 2024-05-10

## 2024-04-13 NOTE — Patient Instructions (Signed)

## 2024-04-13 NOTE — Progress Notes (Signed)
 Isotretinoin  Follow-Up Visit   Subjective  Heidi Mccormick is a 23 y.o. female who presents for the following: Isotretinoin  follow-up  Week # 12, Patient is currently taking 60 mg   Isotretinoin  F/U - 04/13/24 0800       Isotretinoin  Follow Up   iPledge # [id number]    Date 04/13/24    Weight 140 lb 3.2 oz (63.6 kg)    Two Forms of Birth Control Female Condom;Oral Contraceptives (w/ estrogen)    Acne breakouts since last visit? Yes      Dosage   Target Dosage (mg) 10200    Current (To Date) Dosage (mg) 4800    To Go Dosage (mg) 5400      Skin Side Effects   Dry Lips Yes    Nose bleeds Yes    Dry eyes Yes    Dry Skin Yes    Sunburn No      Gastrointestinal Side Effects   Nausea No    Diarrhea No    Blood in stool No      Neurological Side Effects   Blurred vision Yes    Depression No    Headache No    Homicidal thoughts No    Mood Changes No    Suicidal thoughts No      Constitutional Side Effects   Fatigue Yes      Musculoskeletal Side Effects   Muscle aches Yes      Labs Notes   Last labs done 04/13/24    Lab comment Negative   Urine Pregnancy        Side effects: Dry skin, dry lips  Patient is not pregnant, not seeking pregnancy, and not breastfeeding.   The following portions of the chart were reviewed this encounter and updated as appropriate: medications, allergies, medical history  Review of Systems:  No other skin or systemic complaints except as noted in HPI or Assessment and Plan.  Objective  Well appearing patient in no apparent distress; mood and affect are within normal limits.  An examination of the face, neck, chest, and back was performed and relevant findings are noted below.            Assessment & Plan   ACNE VULGARIS Patient is currently on Isotretinoin  requiring FDA mandated monthly evaluations and laboratory monitoring. Condition is currently not to goal (must reach target dose based on weight and also have clear  skin for 2 months prior to discontinuation in order to help prevent relapse)  Exam findings: Dry lips, Dry Skin and Nose Bleeds  Week # 12 Pharmacy: [facility] # [id number] Total mg -  4800 Total mg/kg - 75 mg/kg Birth Control- Condoms and oral birth control  Continue isotretinoin  60 mg  Urine pregnancy test performed in office today and was negative.  Patient demonstrates comprehension and confirms she will not get pregnant.   Patient confirmed in iPledge and isotretinoin  sent to pharmacy.   Isotretinoin  Counseling; Review and Contraception Counseling: Reviewed potential side effects of isotretinoin  including xerosis, cheilitis, hepatitis, hyperlipidemia, and severe birth defects if taken by a pregnant woman.  Women on isotretinoin  must be celibate (not having sex) or required to use at least 2 birth control methods to prevent pregnancy (unless patient is a female of non-child bearing potential).  Females of child-bearing potential must have monthly pregnancy tests while on isotretinoin  and report through I-Pledge (FDA monitoring program). Reviewed reports of suicidal ideation in those with a history of depression while taking isotretinoin   and reports of diagnosis of inflammatory bowl disease (IBD) while taking isotretinoin  as well as the lack of evidence for a causal relationship between isotretinoin , depression and IBD. Patient advised to reach out with any questions or concerns. Patient advised not to share pills or donate blood while on treatment or for one month after completing treatment. All patient's considering Isotretinoin  must read and understand and sign Isotretinoin  Consent Form and be registered with I-Pledge.  Xerosis secondary to isotretinoin  therapy - Recommended taking OTC Fish oil supplements   Cheilitis secondary to isotretinoin  therapy - Continue lip balm as directed, Aquaphor Body Balm Stick recommended  Long term medication management (isotretinoin )  Patient  is using long term (months to years) prescription medication  to control their dermatologic condition.  These medications require periodic monitoring to evaluate for efficacy and side effects and may require periodic laboratory monitoring.  - While taking Isotretinoin  and for 30 days after you finish the medication, do not get pregnant, do not share pills, do not donate blood. Isotretinoin  is best absorbed when taken with a fatty meal. Isotretinoin  can make you sensitive to the sun. Daily careful sun protection including sunscreen SPF 30+ when outdoors is recommended.    Follow-up in 30 days.  I, Jetta Ager, am acting as Neurosurgeon for [employer], DO.   Documentation: I have reviewed the above documentation for accuracy and completeness, and I agree with the above.  Delon Lenis, DO

## 2024-04-25 ENCOUNTER — Ambulatory Visit: Admitting: Psychology

## 2024-04-25 DIAGNOSIS — F419 Anxiety disorder, unspecified: Secondary | ICD-10-CM | POA: Diagnosis not present

## 2024-04-25 NOTE — Progress Notes (Signed)
 [facility] Counselor/Therapist Progress Note  Patient ID: Heidi Mccormick, MRN: [id number]    Date: 04/25/24  Time Spent: 5:00 pm - 5:59 pm : 53 minutes  (At 5:39 pm patient's internet bumped her off our session, but she rejoined at 5:45 pm= 39 minutes.We then continued our session from 5:45 pm - 5:59 pm. Total time = 53 minutes.)   Treatment Type: Individual Therapy.  Reported Symptoms: Anxiety: Feeling nervous, anxious or on edge, not being able to stop or control worrying, worrying too much about different things, becoming easily annoyed or irritable. Depression: little interest or pleasure in doing things, feeling down, depressed, or hopeless, trouble falling or staying asleep, or sleeping too much, feeling tired or having little energy, Feeling bad about yourself--or that you are a failure or have let yourself or your family down.    Mental Status Exam: Appearance:  Casual     Behavior: Appropriate  Motor: Normal  Speech/Language:  Normal Rate  Affect: Appropriate  Mood: normal  Thought process: normal  Thought content:   WNL  Sensory/Perceptual disturbances:   WNL  Orientation: oriented to person, place, and time/date  Attention: Good  Concentration: Good  Memory: WNL  Fund of knowledge:  Good  Insight:   Good  Judgment:  Good  Impulse Control: Fair   Risk Assessment: Danger to Self:  No Self-injurious Behavior: No Danger to Others: No Duty to Warn:no Physical Aggression / Violence:No  Access to Firearms a concern: No  Gang Involvement:No   Subjective:   Heidi Mccormick participated from home, via video, and consented to treatment. I discussed the limitations of evaluation and management by telemedicine and the availability of in person appointments. The patient expressed understanding and agreed to proceed.  Therapist participated from home office.  [name] reviewed the events of the past week.   Patient reported doing better. 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 Patient's mother's birthday is October 18 and patient was worried about whether to acknowledge it or not because of the consequences either way. Patient was receptive to practicing anxiety reduction techniques between now and our next visit.   Interventions: Cognitive Behavioral Therapy  Diagnosis: Anxiety 41.9  Psychiatric Treatment: Yes , via PCP  Treatment Plan:  Client Abilities/Strengths Heidi Mccormick is bright, motivated, interested in self-care, and committed to making healthy changes in her life.   Support System: Patient's inlaws and siblings in law  Client Treatment Preferences OPT  Client Statement of Needs Heidi Mccormick would like to set boundaries with her biological family members, examine her guilt, and learn how to more independent from them.    Treatment Level Every 3-4 weeks.  Symptoms  Heidi Mccormick experiences symptoms of Anxiety (Status : maintained)  Goals:   Target Date: 12/22/24 Frequency: every 3-4 weeks  Progress: 60% Modality: individual    Therapist will provide referrals for additional resources as appropriate.  Therapist will provide psycho-education regarding Heidi Mccormick's diagnosis and corresponding treatment approaches and interventions. Heidi Mccormick will support the patient's ability to achieve the goals identified. will employ CBT, BA, Problem-solving, Solution Focused, Mindfulness,  coping skills, & other evidenced-based practices will be used to promote progress towards healthy functioning to help manage decrease symptoms associated with their diagnosis.   Reduce overall level, frequency, and intensity of the feelings of depression, anxiety  and panic evidenced by decreased overall symptoms from 6 to 7 days/week to 0 to 1 days/week per client report for at least 3 consecutive  months. Verbally express understanding of the relationship between feelings of anxiety and their impact on thinking patterns and behaviors. Verbalize an understanding of the role that distorted thinking plays in creating fears, excessive worry, and ruminations.  Heidi Mccormick participated in the creation of the treatment plan)    Heidi Mccormick

## 2024-05-05 ENCOUNTER — Other Ambulatory Visit: Payer: Self-pay | Admitting: Medical Genetics

## 2024-05-05 DIAGNOSIS — Z006 Encounter for examination for normal comparison and control in clinical research program: Secondary | ICD-10-CM

## 2024-05-10 ENCOUNTER — Encounter: Payer: Self-pay | Admitting: Dermatology

## 2024-05-10 ENCOUNTER — Ambulatory Visit: Admitting: Dermatology

## 2024-05-10 VITALS — BP 99/67 | HR 77 | Wt 143.0 lb

## 2024-05-10 DIAGNOSIS — L700 Acne vulgaris: Secondary | ICD-10-CM

## 2024-05-10 DIAGNOSIS — Z79899 Other long term (current) drug therapy: Secondary | ICD-10-CM

## 2024-05-10 DIAGNOSIS — Z7189 Other specified counseling: Secondary | ICD-10-CM

## 2024-05-10 DIAGNOSIS — L853 Xerosis cutis: Secondary | ICD-10-CM

## 2024-05-10 MED ORDER — ISOTRETINOIN 30 MG PO CAPS: 30.0000 mg | ORAL_CAPSULE | Freq: Two times a day (BID) | ORAL | 0 refills | Status: DC

## 2024-05-10 NOTE — Patient Instructions (Signed)

## 2024-05-10 NOTE — Progress Notes (Signed)
 Isotretinoin  Follow-Up Visit   Subjective  Heidi Mccormick is a 23 y.o. female who presents for the following: Isotretinoin  follow-up  Week # 16, Patient is currently taking 60 mg daily   Isotretinoin  F/U - 05/10/24 1300       Isotretinoin  Follow Up   iPledge # [id number]    Date 05/10/24    Weight 143 lb (64.9 kg)    Two Forms of Birth Control Female Condom;Oral Contraceptives (w/ estrogen)      Dosage   Target Dosage (mg) 10200    Current (To Date) Dosage (mg) 6600    To Go Dosage (mg) 3600      Skin Side Effects   Dry Lips Yes    Nose bleeds Yes    Dry eyes Yes    Dry Skin Yes    Sunburn No      Gastrointestinal Side Effects   Nausea No    Diarrhea No    Blood in stool No      Neurological Side Effects   Blurred vision No    Depression No    Headache No    Homicidal thoughts No    Mood Changes No    Suicidal thoughts No      Constitutional Side Effects   Fatigue Yes      Musculoskeletal Side Effects   Muscle aches No      Other Side Effects   Other Side Effects None      Labs Notes   Last labs done 05/10/24          Side effects: Dry skin, dry lips  Patient is not pregnant, not seeking pregnancy, and not breastfeeding.  The following portions of the chart were reviewed this encounter and updated as appropriate: medications, allergies, medical history  Review of Systems:  No other skin or systemic complaints except as noted in HPI or Assessment and Plan.  Objective  Well appearing patient in no apparent distress; mood and affect are within normal limits.  An examination of the face, neck, chest, and back was performed and relevant findings are noted below.            Assessment & Plan   ACNE VULGARIS Patient is currently on Isotretinoin  requiring FDA mandated monthly evaluations and laboratory monitoring. Condition is currently not to goal (must reach target dose based on weight and also have clear skin for 2 months prior to  discontinuation in order to help prevent relapse)  Exam findings: Dry Skin, Dry eye, Nose Bleeds  Week # 16 Pharmacy: [facility] # [id number] Total mg -  6600 Total mg/kg - 102 Birth Control- Oral and Condoms  Continue isotretinoin  60 mg  Urine pregnancy test performed in office today and was negative.  Patient demonstrates comprehension and confirms she will not get pregnant.   Patient confirmed in iPledge and isotretinoin  sent to pharmacy.   Isotretinoin  Counseling; Review and Contraception Counseling: Reviewed potential side effects of isotretinoin  including xerosis, cheilitis, hepatitis, hyperlipidemia, and severe birth defects if taken by a pregnant woman.  Women on isotretinoin  must be celibate (not having sex) or required to use at least 2 birth control methods to prevent pregnancy (unless patient is a female of non-child bearing potential).  Females of child-bearing potential must have monthly pregnancy tests while on isotretinoin  and report through I-Pledge (FDA monitoring program). Reviewed reports of suicidal ideation in those with a history of depression while taking isotretinoin  and reports of diagnosis of inflammatory bowl disease (IBD)  while taking isotretinoin  as well as the lack of evidence for a causal relationship between isotretinoin , depression and IBD. Patient advised to reach out with any questions or concerns. Patient advised not to share pills or donate blood while on treatment or for one month after completing treatment. All patient's considering Isotretinoin  must read and understand and sign Isotretinoin  Consent Form and be registered with I-Pledge.  Xerosis secondary to isotretinoin  therapy - Continue emollients as directed  Cheilitis secondary to isotretinoin  therapy - Continue lip balm as directed, Aquaphor Body Balm recommended  Long term medication management (isotretinoin )  Patient is using long term (months to years) prescription medication  to  control their dermatologic condition.  These medications require periodic monitoring to evaluate for efficacy and side effects and may require periodic laboratory monitoring.  - While taking Isotretinoin  and for 30 days after you finish the medication, do not get pregnant, do not share pills, do not donate blood. Isotretinoin  is best absorbed when taken with a fatty meal. Isotretinoin  can make you sensitive to the sun. Daily careful sun protection including sunscreen SPF 30+ when outdoors is recommended.  HIGH RISK MEDICATION USE   Related Medications ISOtretinoin  (ACCUTANE ) 30 MG capsule Take 1 capsule (30 mg total) by mouth 2 (two) times daily. ACNE VULGARIS   Related Medications ISOtretinoin  (ACCUTANE ) 30 MG capsule Take 1 capsule (30 mg total) by mouth 2 (two) times daily.  Follow-up in 30 days.  I, Jetta Ager, am acting as Neurosurgeon for [employer], DO.  Documentation: I have reviewed the above documentation for accuracy and completeness, and I agree with the above.  Delon Lenis, DO

## 2024-05-16 ENCOUNTER — Ambulatory Visit (INDEPENDENT_AMBULATORY_CARE_PROVIDER_SITE_OTHER): Admitting: Psychology

## 2024-05-16 DIAGNOSIS — F419 Anxiety disorder, unspecified: Secondary | ICD-10-CM | POA: Diagnosis not present

## 2024-05-16 NOTE — Progress Notes (Signed)
 [facility] Counselor/Therapist Progress Note  Patient ID: Heidi Mccormick, MRN: [id number]    Date: 05/16/24  Time Spent: 5:00 pm - 5:53 pm : 53 minutes    Treatment Type: Individual Therapy.  Reported Symptoms:  Anxiety: Feeling nervous, anxious or on edge, not being able to stop or control worrying, worrying too much about different things, becoming easily annoyed or irritable. Depression: little interest or pleasure in doing things, feeling down, depressed, or hopeless, trouble falling or staying asleep, or sleeping too much, feeling tired or having little energy, Feeling bad about yourself--or that you are a failure or have let yourself or your family down.   Mental Status Exam: Appearance:  Well Groomed     Behavior: Appropriate  Motor: Normal  Speech/Language:  Normal Rate  Affect: Appropriate  Mood: anxious  Thought process: normal  Thought content:   WNL  Sensory/Perceptual disturbances:   WNL  Orientation: oriented to person, place, and time/date  Attention: Good  Concentration: Good  Memory: WNL  Fund of knowledge:  Good  Insight:   Good  Judgment:  Good  Impulse Control: Good   Risk Assessment: Danger to Self:  No Self-injurious Behavior: No Danger to Others: No Duty to Warn:no Physical Aggression / Violence:No  Access to Firearms a concern: No  Gang Involvement:No   Subjective:   Heidi Mccormick participated from home, via video, and consented to treatment. I discussed the limitations of evaluation and management by telemedicine and the availability of in person appointments. The patient expressed understanding and agreed to proceed.  Therapist participated from home office.  Heidi Mccormick reviewed the events of the past week.   Patient was pleased with how well she did her first quarter of college. Patient was anxious about her next quarter and the quarter after that, but she is also trying to be optimistic. Patient was also shocked that her mother  responded positively to the birthday text that the patient had sent to her. Patient was worried about what the upcoming holiday season would mean to her, and how it would impact her, and her husband.   Interventions: Cognitive Behavioral Therapy  Diagnosis: Anxiety 41.9  Psychiatric Treatment: Yes , via PCP  Treatment Plan:  Client Abilities/Strengths Heidi Mccormick is bright, motivated, interested in self-care, and committed to making healthy changes in her life.   Support System: Patient's new husband, her in laws and siblings in law  Client Treatment Preferences OPT  Client Statement of Needs Heidi Mccormick would like to set boundaries with her biological family members, examine her guilt, and learn how to be more independent from them.    Treatment Level Monthly  Symptoms  Heidi Mccormick experiences symptoms of Anxiety (Status : Maintained)  Goals:   Target Date: 12/22/24 Frequency: Monthly  Progress: 60% Modality: individual    Therapist will provide referrals for additional resources as appropriate.  Therapist will provide psycho-education regarding Dajia's diagnosis and corresponding treatment approaches and interventions. Heidi Mccormick will support the patient's ability to achieve the goals identified. will employ CBT, BA, Problem-solving, Solution Focused, Mindfulness,  coping skills, & other evidenced-based practices will be used to promote progress towards healthy functioning to help manage decrease symptoms associated with their diagnosis.   Reduce overall level, frequency, and intensity of the feelings of depression, anxiety and panic evidenced by decreased overall symptoms  from 6 to 7 days/week to 0 to 1 days/week per client report for at least 3 consecutive months. Verbally express understanding of the relationship between feelings of  anxiety and their impact on thinking patterns and behaviors. Verbalize an understanding of the role that distorted thinking plays in creating fears,  excessive worry, and ruminations.  Heidi Mccormick participated in the creation of the treatment plan)    Heidi Mccormick

## 2024-06-08 ENCOUNTER — Ambulatory Visit (INDEPENDENT_AMBULATORY_CARE_PROVIDER_SITE_OTHER): Admitting: Dermatology

## 2024-06-08 ENCOUNTER — Encounter: Payer: Self-pay | Admitting: Dermatology

## 2024-06-08 VITALS — BP 106/71 | HR 88 | Wt 140.0 lb

## 2024-06-08 DIAGNOSIS — Z79899 Other long term (current) drug therapy: Secondary | ICD-10-CM | POA: Diagnosis not present

## 2024-06-08 DIAGNOSIS — Z7189 Other specified counseling: Secondary | ICD-10-CM

## 2024-06-08 DIAGNOSIS — L700 Acne vulgaris: Secondary | ICD-10-CM | POA: Diagnosis not present

## 2024-06-08 DIAGNOSIS — L853 Xerosis cutis: Secondary | ICD-10-CM | POA: Diagnosis not present

## 2024-06-08 MED ORDER — ISOTRETINOIN 40 MG PO CAPS: 40.0000 mg | ORAL_CAPSULE | Freq: Every day | ORAL | 0 refills | Status: DC

## 2024-06-08 NOTE — Progress Notes (Signed)
 Isotretinoin  Follow-Up Visit   Subjective  Heidi Mccormick is a 23 y.o. female who presents for the following: Isotretinoin  follow-up  Week # 20, Currently taking 60 mg daily   Isotretinoin  F/U - 06/08/24 1100       Isotretinoin  Follow Up   iPledge # [id number]    Date 06/08/24    Weight 140 lb (63.5 kg)    Two Forms of Birth Control Female Condom;Oral Contraceptives (w/ estrogen)    Acne breakouts since last visit? No      Dosage   Target Dosage (mg) 10200    Current (To Date) Dosage (mg) 8400    To Go Dosage (mg) 1800      Skin Side Effects   Dry Lips Yes    Nose bleeds Yes    Dry eyes No    Dry Skin Yes    Sunburn No      Gastrointestinal Side Effects   Nausea No    Diarrhea No    Blood in stool No      Neurological Side Effects   Blurred vision No    Depression No    Headache No    Homicidal thoughts No    Mood Changes No    Suicidal thoughts No      Constitutional Side Effects   Fatigue Yes      Musculoskeletal Side Effects   Muscle aches No      Other Side Effects   Other Side Effects Excessive tired      Labs Notes   Last labs done 06/08/24    Lab comment Negative   Urine Pregnancy        Side effects: Dry skin, dry lips  Patient is not pregnant, not seeking pregnancy, and not breastfeeding.  The following portions of the chart were reviewed this encounter and updated as appropriate: medications, allergies, medical history  Review of Systems:  No other skin or systemic complaints except as noted in HPI or Assessment and Plan.  Objective  Well appearing patient in no apparent distress; mood and affect are within normal limits.  An examination of the face, neck, chest, and back was performed and relevant findings are noted below.          Assessment & Plan  ACNE VULGARIS Patient is currently on Isotretinoin  requiring FDA mandated monthly evaluations and laboratory monitoring. Condition is currently not to goal (must reach target  dose based on weight and also have clear skin for 2 months prior to discontinuation in order to help prevent relapse)  Exam findings: Dry Skin, Dry eyes, Nose bleed  Week # 20 Pharmacy: [facility] # [id number] Total mg -  8400 Total mg/kg - 131 mg/kg Birth Control- Condoms and Oral Birth COntrol  Decrease isotretinoin  to 40 mg daily  Urine pregnancy test performed in office today and was negative.  Patient demonstrates comprehension and confirms she will not get pregnant.   Patient confirmed in iPledge and isotretinoin  sent to pharmacy.  Isotretinoin  Counseling; Review and Contraception Counseling: Reviewed potential side effects of isotretinoin  including xerosis, cheilitis, hepatitis, hyperlipidemia, and severe birth defects if taken by a pregnant woman.  Women on isotretinoin  must be celibate (not having sex) or required to use at least 2 birth control methods to prevent pregnancy (unless patient is a female of non-child bearing potential).  Females of child-bearing potential must have monthly pregnancy tests while on isotretinoin  and report through I-Pledge (FDA monitoring program). Reviewed reports of suicidal ideation in those  with a history of depression while taking isotretinoin  and reports of diagnosis of inflammatory bowl disease (IBD) while taking isotretinoin  as well as the lack of evidence for a causal relationship between isotretinoin , depression and IBD. Patient advised to reach out with any questions or concerns. Patient advised not to share pills or donate blood while on treatment or for one month after completing treatment. All patient's considering Isotretinoin  must read and understand and sign Isotretinoin  Consent Form and be registered with I-Pledge.  Xerosis secondary to isotretinoin  therapy - Continue emollients as directed - Xyzal (levocetirizine) once a day and fish oil 1 gram daily may also help with dryness   Cheilitis secondary to isotretinoin  therapy -  Continue lip balm as directed, Aquaphor Body Balm recommended  Long term medication management (isotretinoin )  Patient is using long term (months to years) prescription medication  to control their dermatologic condition.  These medications require periodic monitoring to evaluate for efficacy and side effects and may require periodic laboratory monitoring.  - While taking Isotretinoin  and for 30 days after you finish the medication, do not get pregnant, do not share pills, do not donate blood. Isotretinoin  is best absorbed when taken with a fatty meal. Isotretinoin  can make you sensitive to the sun. Daily careful sun protection including sunscreen SPF 30+ when outdoors is recommended.  HIGH RISK MEDICATION USE   ACNE VULGARIS    Follow-up in 30 days.  I, Jabril Pursell, am acting as neurosurgeon for [employer], DO.  Documentation: I have reviewed the above documentation for accuracy and completeness, and I agree with the above.  Delon Lenis, DO

## 2024-06-08 NOTE — Patient Instructions (Addendum)
 VISIT SUMMARY:  You came in today because you have been experiencing significant fatigue since starting your Accutane  treatment. We discussed how this fatigue has been affecting your daily routine and other symptoms you have been experiencing, such as dry skin, dry eyes, and occasional nosebleeds.  YOUR PLAN:  -FATIGUE: Fatigue can be a side effect of Accutane  (isotretinoin ). We will reduce your dose from 60 mg to 40 mg to see if this helps. We also ordered blood tests to check for anemia and other possible causes of your fatigue. Please take your Accutane  with a fatty meal and start a B complex vitamin after we get your lab results. Monitor for any worsening of fatigue or other symptoms like mood changes, joint pain, headaches, or abdominal pain.  -ACNE VULGARIS: Acne vulgaris is a common skin condition that causes pimples. You are currently on Accutane  to treat this. We will continue your treatment at a reduced dose of 40 mg due to the fatigue you are experiencing. The total planned treatment duration is approximately three months at this dose.  -DRY SKIN, DRY EYES, AND EPISTAXIS: Dry skin, dry eyes, and occasional nosebleeds are known side effects of Accutane . Please monitor these symptoms and let us  know if they worsen.  INSTRUCTIONS:  We have ordered blood tests including CBC, CMP, TSH, T4, ferritin, and vitamin B levels to evaluate for anemia and other metabolic causes of your fatigue. Please follow up with us  once the lab results are available. Continue taking Accutane  with a fatty meal and start a B complex vitamin after we review your lab results. Monitor for any worsening of fatigue or other symptoms such as mood changes, joint pain, headaches, or abdominal pain.      Important Information   Due to recent changes in healthcare laws, you may see results of your pathology and/or laboratory studies on MyChart before the doctors have had a chance to review them. We understand that in  some cases there may be results that are confusing or concerning to you. Please understand that not all results are received at the same time and often the doctors may need to interpret multiple results in order to provide you with the best plan of care or course of treatment. Therefore, we ask that you please give us  2 business days to thoroughly review all your results before contacting the office for clarification. Should we see a critical lab result, you will be contacted sooner.     If You Need Anything After Your Visit   If you have any questions or concerns for your doctor, please call our main line at [phone]. If no one answers, please leave a voicemail as directed and we will return your call as soon as possible. Messages left after 4 pm will be answered the following business day.    You may also send us  a message via MyChart. We typically respond to MyChart messages within 1-2 business days.  For prescription refills, please ask your pharmacy to contact our office. Our fax number is [phone].  If you have an urgent issue when the clinic is closed that cannot wait until the next business day, you can page your doctor at the number below.     Please note that while we do our best to be available for urgent issues outside of office hours, we are not available 24/7.    If you have an urgent issue and are unable to reach us , you may choose to seek medical care at  your doctor's office, retail clinic, urgent care center, or emergency room.   If you have a medical emergency, please immediately call 911 or go to the emergency department. In the event of inclement weather, please call our main line at [phone] for an update on the status of any delays or closures.  Dermatology Medication Tips: Please keep the boxes that topical medications come in in order to help keep track of the instructions about where and how to use these. Pharmacies typically print the medication instructions  only on the boxes and not directly on the medication tubes.   If your medication is too expensive, please contact our office at [phone] or send us  a message through MyChart.    We are unable to tell what your co-pay for medications will be in advance as this is different depending on your insurance coverage. However, we may be able to find a substitute medication at lower cost or fill out paperwork to get insurance to cover a needed medication.    If a prior authorization is required to get your medication covered by your insurance company, please allow us  1-2 business days to complete this process.   Drug prices often vary depending on where the prescription is filled and some pharmacies may offer cheaper prices.   The website www.goodrx.com contains coupons for medications through different pharmacies. The prices here do not account for what the cost may be with help from insurance (it may be cheaper with your insurance), but the website can give you the price if you did not use any insurance.  - You can print the associated coupon and take it with your prescription to the pharmacy.  - You may also stop by our office during regular business hours and pick up a GoodRx coupon card.  - If you need your prescription sent electronically to a different pharmacy, notify our office through [facility] or by phone at [phone]

## 2024-06-13 ENCOUNTER — Ambulatory Visit: Admitting: Psychology

## 2024-06-14 ENCOUNTER — Other Ambulatory Visit (HOSPITAL_BASED_OUTPATIENT_CLINIC_OR_DEPARTMENT_OTHER): Payer: Self-pay

## 2024-06-14 ENCOUNTER — Other Ambulatory Visit: Payer: Self-pay

## 2024-06-14 ENCOUNTER — Other Ambulatory Visit: Payer: Self-pay | Admitting: Family Medicine

## 2024-06-14 ENCOUNTER — Ambulatory Visit: Payer: Self-pay | Admitting: Dermatology

## 2024-06-14 LAB — COMPREHENSIVE METABOLIC PANEL WITH GFR
ALT: 9 IU/L (ref 0–32)
AST: 20 IU/L (ref 0–40)
Albumin: 4.5 g/dL (ref 4.0–5.0)
Alkaline Phosphatase: 77 IU/L (ref 41–116)
BUN/Creatinine Ratio: 16 (ref 9–23)
BUN: 12 mg/dL (ref 6–20)
Bilirubin Total: 0.4 mg/dL (ref 0.0–1.2)
CO2: 22 mmol/L (ref 20–29)
Calcium: 9.5 mg/dL (ref 8.7–10.2)
Chloride: 101 mmol/L (ref 96–106)
Creatinine, Ser: 0.76 mg/dL (ref 0.57–1.00)
Globulin, Total: 3.1 g/dL (ref 1.5–4.5)
Glucose: 90 mg/dL (ref 70–99)
Potassium: 4.2 mmol/L (ref 3.5–5.2)
Sodium: 137 mmol/L (ref 134–144)
Total Protein: 7.6 g/dL (ref 6.0–8.5)
eGFR: 111 mL/min/1.73 (ref >59)

## 2024-06-14 LAB — IRON,TIBC AND FERRITIN PANEL
Ferritin: 48 ng/mL (ref 15–150)
Iron Saturation: 24 % (ref 15–55)
Iron: 82 ug/dL (ref 27–159)
Total Iron Binding Capacity: 339 ug/dL (ref 250–450)
UIBC: 257 ug/dL (ref 131–425)

## 2024-06-14 LAB — CBC WITH DIFFERENTIAL/PLATELET
Basophils Absolute: 0 x10E3/uL (ref 0.0–0.2)
Basos: 0 %
EOS (ABSOLUTE): 0.1 x10E3/uL (ref 0.0–0.4)
Eos: 1 %
Hematocrit: 42.5 % (ref 34.0–46.6)
Hemoglobin: 13.9 g/dL (ref 11.1–15.9)
Immature Grans (Abs): 0 x10E3/uL (ref 0.0–0.1)
Immature Granulocytes: 0 %
Lymphocytes Absolute: 2.3 x10E3/uL (ref 0.7–3.1)
Lymphs: 28 %
MCH: 31.3 pg (ref 26.6–33.0)
MCHC: 32.7 g/dL (ref 31.5–35.7)
MCV: 96 fL (ref 79–97)
Monocytes Absolute: 0.6 x10E3/uL (ref 0.1–0.9)
Monocytes: 7 %
Neutrophils Absolute: 5.1 x10E3/uL (ref 1.4–7.0)
Neutrophils: 64 %
Platelets: 282 x10E3/uL (ref 150–450)
RBC: 4.44 x10E6/uL (ref 3.77–5.28)
RDW: 13.1 % (ref 11.7–15.4)
WBC: 8 x10E3/uL (ref 3.4–10.8)

## 2024-06-14 LAB — VITAMIN B2, WHOLE BLOOD: Vitamin B2, Whole Blood: 178 ug/L (ref 137–370)

## 2024-06-14 LAB — T4, FREE: Free T4: 1.16 ng/dL (ref 0.82–1.77)

## 2024-06-14 LAB — TSH: TSH: 1.62 u[IU]/mL (ref 0.450–4.500)

## 2024-06-14 MED ORDER — FLUOXETINE HCL 20 MG PO CAPS
20.0000 mg | ORAL_CAPSULE | Freq: Every day | ORAL | 0 refills | Status: DC
  Filled 2024-06-14: qty 30, 30d supply, fill #0

## 2024-07-04 ENCOUNTER — Ambulatory Visit: Admitting: Psychology

## 2024-07-04 DIAGNOSIS — F419 Anxiety disorder, unspecified: Secondary | ICD-10-CM | POA: Diagnosis not present

## 2024-07-04 NOTE — Progress Notes (Signed)
 [facility] Counselor/Therapist Progress Note  Patient ID: Heidi Mccormick, MRN: [id number]    Date: 07/04/24  Time Spent: 5:00 pm - 5:53 pm : 53 minutes  Treatment Type: Individual Therapy.  Reported Symptoms: Anxiety: Feeling nervous, anxious or on edge, not being able to stop or control worrying, worrying too much about different things, becoming easily annoyed or irritable. Depression: little interest or pleasure in doing things, feeling down, depressed, or hopeless, trouble falling or staying asleep, or sleeping too much, feeling tired or having little energy, Feeling bad about yourself--or that you are a failure or have let yourself or your family down.    Mental Status Exam: Appearance:  Well Groomed     Behavior: Appropriate  Motor: Normal  Speech/Language:  Normal Rate  Affect: Appropriate  Mood: anxious  Thought process: normal  Thought content:   WNL  Sensory/Perceptual disturbances:   WNL  Orientation: oriented to person, place, and time/date  Attention: Good  Concentration: Good  Memory: WNL  Fund of knowledge:  Good  Insight:   Good  Judgment:  Good  Impulse Control: Good   Risk Assessment: Danger to Self:  No Self-injurious Behavior: No Danger to Others: No Duty to Warn:no Physical Aggression / Violence:No  Access to Firearms a concern: No  Gang Involvement:No   Subjective:   Heidi Mccormick participated from home, via video, and consented to treatment. I discussed the limitations of evaluation and management by telemedicine and the availability of in person appointments. The patient expressed understanding and agreed to proceed.  Therapist participated from home office.  Heidi Mccormick reviewed the events of the past week.   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 Patient reports still being very happily married,  very happy in her job (school), Patient has been having dreams about someone getting married and her mom is always there trying to be in control. Patient has been worrying a lot about why she is thinking about her family, and she has trouble relaxing because of her preoccupation with her family on Facebook and in her thoughts.   Patient mentioned my ex's mother    Interventions: Cognitive Behavioral Therapy  Mccormick: Anxiety 41.9  Psychiatric Treatment: Yes , via psychiatrist  Treatment Plan:  Client Abilities/Strengths Heidi Mccormick is bright, motivated, interested in self-care, and committed to making healthy changes in her life.  Support System: Patient's new husband, her in laws and siblings in law  Client Treatment Preferences OPT  Client Statement of Needs Heidi Mccormick would like to set boundaries with her biological family members, examine her guilt, and learn how to be more independent from them.    Treatment Level Weekly  Symptoms  Heidi Mccormick experiences symptoms of Anxiety (Status : Maintained)  Goals:  Target Date: 12/22/24 Frequency: Monthly  Progress: 65% Modality: individual    Therapist will provide referrals for additional resources as appropriate.  Therapist will provide psycho-education regarding Heidi Mccormick and corresponding treatment approaches and interventions. [name] will support the patient's ability to achieve the goals identified. will employ CBT, BA, Problem-solving, Solution Focused, Mindfulness,  coping skills, & other evidenced-based practices will be used to promote progress towards healthy functioning to help manage decrease symptoms associated with their Mccormick.   Reduce overall level, frequency, and intensity of the feelings of depression, anxiety and panic evidenced by decreased overall symptoms from 6 to 7  days/week to 0 to 1 days/week per client report for at least 3 consecutive months. Verbally express understanding of the relationship  between feelings of anxiety and their impact on thinking patterns and behaviors. Verbalize an understanding of the role that distorted thinking plays in creating fears, excessive worry, and ruminations.  [name] in the creation of the treatment plan)    [name]

## 2024-07-06 ENCOUNTER — Encounter: Payer: Self-pay | Admitting: Dermatology

## 2024-07-06 ENCOUNTER — Other Ambulatory Visit (HOSPITAL_BASED_OUTPATIENT_CLINIC_OR_DEPARTMENT_OTHER): Payer: Self-pay

## 2024-07-06 ENCOUNTER — Other Ambulatory Visit: Payer: Self-pay

## 2024-07-06 ENCOUNTER — Ambulatory Visit: Admitting: Dermatology

## 2024-07-06 VITALS — BP 118/68 | HR 91 | Wt 140.0 lb

## 2024-07-06 DIAGNOSIS — Z7189 Other specified counseling: Secondary | ICD-10-CM | POA: Diagnosis not present

## 2024-07-06 DIAGNOSIS — L700 Acne vulgaris: Secondary | ICD-10-CM | POA: Diagnosis not present

## 2024-07-06 DIAGNOSIS — L650 Telogen effluvium: Secondary | ICD-10-CM

## 2024-07-06 DIAGNOSIS — Z79899 Other long term (current) drug therapy: Secondary | ICD-10-CM

## 2024-07-06 DIAGNOSIS — T50995A Adverse effect of other drugs, medicaments and biological substances, initial encounter: Secondary | ICD-10-CM

## 2024-07-06 DIAGNOSIS — L853 Xerosis cutis: Secondary | ICD-10-CM

## 2024-07-06 DIAGNOSIS — L659 Nonscarring hair loss, unspecified: Secondary | ICD-10-CM

## 2024-07-06 MED ORDER — TRETINOIN 0.05 % EX CREA
TOPICAL_CREAM | CUTANEOUS | 6 refills | Status: AC
  Filled 2024-07-06: qty 45, 30d supply, fill #0

## 2024-07-06 MED ORDER — MINOXIDIL 2.5 MG PO TABS
1.2500 mg | ORAL_TABLET | Freq: Every day | ORAL | 6 refills | Status: AC
  Filled 2024-07-06: qty 30, 60d supply, fill #0

## 2024-07-06 MED ORDER — ISOTRETINOIN 40 MG PO CAPS: 40.0000 mg | ORAL_CAPSULE | Freq: Every day | ORAL | 0 refills | Status: AC

## 2024-07-06 NOTE — Patient Instructions (Addendum)

## 2024-07-06 NOTE — Progress Notes (Signed)
 "  Isotretinoin  Follow-Up Visit  Patient (and/or pt guardian) consented to the use of AI-assisted tools for note generation.   Subjective  Heidi Mccormick is a 23 y.o. female who presents for the following: Isotretinoin  follow-up  Week # 24, Currently taking 40 mg daily   Isotretinoin  F/U - 07/06/24 0900       Isotretinoin  Follow Up   iPledge # [id number]    Date 07/06/24    Weight 140 lb (63.5 kg)    Two Forms of Birth Control Female Condom;Oral Contraceptives (w/ estrogen)    Acne breakouts since last visit? No      Dosage   Target Dosage (mg) 10200    Current (To Date) Dosage (mg) 9600    To Go Dosage (mg) 600      Skin Side Effects   Dry Lips Yes    Nose bleeds Yes    Dry eyes No    Dry Skin Yes    Sunburn No      Gastrointestinal Side Effects   Nausea No    Diarrhea No    Blood in stool No      Neurological Side Effects   Blurred vision No    Depression No    Headache No    Homicidal thoughts No    Mood Changes No    Suicidal thoughts No      Constitutional Side Effects   Fatigue Yes      Musculoskeletal Side Effects   Muscle aches No      Labs Notes   Last labs done 07/06/24    Lab comment Negative   Urine pregnancy test          Side effects: Dry skin, dry lips Patient reports no recent flare ups Patient is using Avene cilcalfate and Avene tolerance as moisturizers and reports they work well with her skin  Patient is not pregnant, not seeking pregnancy, and not breastfeeding.   The following portions of the chart were reviewed this encounter and updated as appropriate: medications, allergies, medical history  Review of Systems:  No other skin or systemic complaints except as noted in HPI or Assessment and Plan.  Objective  Well appearing patient in no apparent distress; mood and affect are within normal limits.  An examination of the face, neck, chest, and back was performed and relevant findings are noted below.          Assessment  & Plan  ACNE VULGARIS   This Visit - ISOtretinoin  (ACCUTANE ) 40 MG capsule - Take 1 capsule (40 mg total) by mouth daily. - tretinoin  (RETIN-A ) 0.05 % cream - Apply topically at night, three nights a week HAIR LOSS   This Visit - minoxidil  (LONITEN ) 2.5 MG tablet - Take 0.5 tablets (1.25 mg total) by mouth daily.  ACNE VULGARIS Patient is currently on Isotretinoin  requiring FDA mandated monthly evaluations and laboratory monitoring. Condition is currently not to goal (must reach target dose based on weight and also have clear skin for 2 months prior to discontinuation in order to help prevent relapse)  Exam findings:  Week # 24 Pharmacy [facility] iPLEDGE # [id number] Total mg -  9600 mg Total mg/kg - 151 mg/kg Birth Control- Oral Contraceptive and Condoms  Continue isotretinoin  for two more weeks Advised not to give blood for two months after stopping Recommended use of Tretinoin  or a benzoyl peroxide  spot treatment in the event of a flare up Continue with Avene Tolerance and Cicalfate moisturizers   Urine  pregnancy test performed in office today and was negative.  Patient demonstrates comprehension and confirms she will not get pregnant.   HCG Urine Test Diptstick LOT: [id number] EXP: 11/08/2025    Patient confirmed in iPledge and isotretinoin  sent to pharmacy.  Isotretinoin  Counseling; Review and Contraception Counseling: Reviewed potential side effects of isotretinoin  including xerosis, cheilitis, hepatitis, hyperlipidemia, and severe birth defects if taken by a pregnant woman.  Women on isotretinoin  must be celibate (not having sex) or required to use at least 2 birth control methods to prevent pregnancy (unless patient is a female of non-child bearing potential).  Females of child-bearing potential must have monthly pregnancy tests while on isotretinoin  and report through I-Pledge (FDA monitoring program). Reviewed reports of suicidal ideation in those with a history  of depression while taking isotretinoin  and reports of diagnosis of inflammatory bowl disease (IBD) while taking isotretinoin  as well as the lack of evidence for a causal relationship between isotretinoin , depression and IBD. Patient advised to reach out with any questions or concerns. Patient advised not to share pills or donate blood while on treatment or for one month after completing treatment. All patient's considering Isotretinoin  must read and understand and sign Isotretinoin  Consent Form and be registered with I-Pledge.   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  These medications require periodic monitoring to evaluate for efficacy and side effects and may require periodic laboratory monitoring.  - While taking Isotretinoin  and for 30 days after you finish the medication, do not get pregnant, do not share pills, do not donate blood. Isotretinoin  is best absorbed when taken with a fatty meal. Isotretinoin  can make you sensitive to the sun. Daily careful sun protection including sunscreen SPF 30+ when outdoors is recommended.   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 Discussed oral minoxidil  as a treatment option, with 90% of patients experiencing hair regrowth without excess hair growth. Potential side effects include lightheadedness, dizziness, and chest fluttering. Collagen supplementation and Nutrafol or Viviscal supplements were also discussed as adjunctive treatments.  - Prescribed oral minoxidil , half a tablet (1.25 mg) daily at night. - Monitor for side effects such as lightheadedness,  dizziness, and chest fluttering. - Consider collagen supplementation and Nutrafol or Viviscal supplements.  Follow-up in 30 days.  Heidi Mccormick Lyle Cords, am acting as a neurosurgeon for [employer], DO .   Documentation: I have reviewed the above documentation for accuracy and completeness, and I agree with the above.  Delon Lenis, DO  "

## 2024-07-17 ENCOUNTER — Encounter: Payer: Self-pay | Admitting: Family Medicine

## 2024-07-17 ENCOUNTER — Ambulatory Visit: Admitting: Family Medicine

## 2024-07-17 ENCOUNTER — Other Ambulatory Visit (HOSPITAL_BASED_OUTPATIENT_CLINIC_OR_DEPARTMENT_OTHER): Payer: Self-pay

## 2024-07-17 VITALS — BP 108/70 | HR 89 | Temp 97.7°F | Ht 66.54 in | Wt 145.4 lb

## 2024-07-17 DIAGNOSIS — G4710 Hypersomnia, unspecified: Secondary | ICD-10-CM | POA: Diagnosis not present

## 2024-07-17 DIAGNOSIS — Z8659 Personal history of other mental and behavioral disorders: Secondary | ICD-10-CM | POA: Diagnosis not present

## 2024-07-17 DIAGNOSIS — Z23 Encounter for immunization: Secondary | ICD-10-CM | POA: Diagnosis not present

## 2024-07-17 DIAGNOSIS — R110 Nausea: Secondary | ICD-10-CM | POA: Diagnosis not present

## 2024-07-17 DIAGNOSIS — R42 Dizziness and giddiness: Secondary | ICD-10-CM | POA: Diagnosis not present

## 2024-07-17 DIAGNOSIS — F411 Generalized anxiety disorder: Secondary | ICD-10-CM | POA: Diagnosis not present

## 2024-07-17 MED ORDER — FLUOXETINE HCL 20 MG PO CAPS
20.0000 mg | ORAL_CAPSULE | Freq: Every day | ORAL | 1 refills | Status: AC
  Filled 2024-07-17: qty 90, 90d supply, fill #0

## 2024-07-17 NOTE — Progress Notes (Signed)
 OFFICE VISIT  07/17/2024  CC:  Chief Complaint  Patient presents with   Medical Management of Chronic Issues   Patient is a 23 y.o. female who presents for 44-month follow-up anxiety and depression. A/P as of last visit: #1 GAD and MDD. Great response to fluoxetine  20 mg a day.  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 Okay to continue alprazolam  0.5 mg twice daily as needed as well.  INTERIM HX: Heidi Mccormick is doing good from an anxiety standpoint. No depression. No panic attacks. She has not had to use her alprazolam  in quite some time.  She continues to have problem with excessive sleepiness in the afternoons when she gets home from work.  Always takes a nap for several hours.  She can then go back to sleep that evening without problem.  In the mornings she wakes up to get ready and has significant dizziness and nausea (longstanding problem).  Once she gets ready and goes to work she is fine. Denies vertigo or syncope.  No palpitations or chest pain.  She has responded very well to Accutane  prescribed by her dermatologist.  She has about 2 weeks left to take this.  PMP AWARE reviewed today: most recent rx for alprazolam  was filled 11/18/2023, # 30, rx by me. No red flags.  Past Medical History:  Diagnosis Date   Abnormal vaginal bleeding    Dr. Rox   Acid reflux    Acne vulgaris    Anxiety    Headache syndrome    summer 2022   History of community acquired pneumonia 05/2021   Menometrorrhagia    Started Yaz 01/30/19   Patellofemoral arthritis    bilat 2023   Vasovagal syncope    summer 2022    Past Surgical History:  Procedure Laterality Date   KNEE ARTHROSCOPY WITH LATERAL RELEASE Left 08/05/2022   Procedure: LEFT KNEE ARTHROSCOPY, LATERAL RELEASE, MICROFRACTURE;  Surgeon: Jerri Kay HERO, MD;  Location: [facility];  Service: Orthopedics;  Laterality: Left;   TRANSTHORACIC ECHOCARDIOGRAM     03/2023 NORMAL   Zio monitor     02/2023 NORMAL    Outpatient Medications Prior to Visit  Medication Sig Dispense Refill   ISOtretinoin  (ACCUTANE ) 40 MG capsule Take 1 capsule (40 mg total) by mouth daily. 30 capsule 0   minoxidil  (LONITEN ) 2.5 MG tablet Take 0.5 tablets (1.25 mg total) by mouth daily. 30 tablet 6   norethindrone  (MICRONOR ) 0.35 MG tablet Take 1 tablet (0.35 mg total) by mouth daily. 84 tablet 3   ALPRAZolam  (XANAX ) 0.5 MG tablet Take 1 tablet (0.5 mg total) by mouth 2 (two) times daily as needed for severe anxiety. (Patient not taking: Reported on 07/17/2024) 30 tablet 0   tretinoin  (RETIN-A ) 0.05 % cream Apply topically at night, three nights a week (Patient not taking: Reported on 07/17/2024) 45 g 6   FLUoxetine  (PROZAC ) 20 MG capsule Take 1 capsule (20 mg total) by mouth daily. OFFICE VISIT NEEDED FOR FURTHER REFILLS 30 capsule 0   No facility-administered medications prior to visit.    Allergies[1]  Review of Systems As per HPI  PE:    07/17/2024    1:33 PM 07/06/2024    9:00 AM 06/08/2024   11:00 AM  Vitals with BMI  Height 5' 6.54    Weight 145  lbs 6 oz 140 lbs 140 lbs  BMI 23.09    Systolic 108 118 893  Diastolic 70 68 71  Pulse 89 91 88   Physical Exam  Gen: Alert, well appearing.  Patient is oriented to person, place, time, and situation. AFFECT: pleasant, lucid thought and speech. CV: RRR, no m/r/g.  Heart rate 93 sitting.  Went up to 104 standing. LUNGS: CTA bilat, nonlabored resps, good aeration in all lung fields. EXT: no clubbing or cyanosis.  no edema.   LABS:  Last CBC Lab Results  Component Value Date   WBC 8.0 06/08/2024   HGB 13.9 06/08/2024   HCT 42.5 06/08/2024   MCV 96 06/08/2024   MCH 31.3 06/08/2024   RDW 13.1 06/08/2024   PLT 282 06/08/2024   Last metabolic panel Lab Results  Component Value Date   GLUCOSE 90 06/08/2024   NA 137  06/08/2024   K 4.2 06/08/2024   CL 101 06/08/2024   CO2 22 06/08/2024   BUN 12 06/08/2024   CREATININE 0.76 06/08/2024   EGFR 111 06/08/2024   CALCIUM 9.5 06/08/2024   PROT 7.6 06/08/2024   ALBUMIN 4.5 06/08/2024   LABGLOB 3.1 06/08/2024   BILITOT 0.4 06/08/2024   ALKPHOS 77 06/08/2024   AST 20 06/08/2024   ALT 9 06/08/2024   Lab Results  Component Value Date   IRON 82 06/08/2024   TIBC 339 06/08/2024   FERRITIN 48 06/08/2024   Lab Results  Component Value Date   TSH 1.620 06/08/2024   FREET4 1.16 06/08/2024   IMPRESSION AND PLAN:  #1 GAD with history of panic. Prozac  20 mg daily long-term has been very helpful. She has alprazolam  to use 0.5 mg twice daily as needed.  A new prescription was not needed today.  2.  Excessive sleepiness. She does not snore.  No witnessed apnea. Question medication side effect (Accutane ). She finishes this in a couple of weeks and she will see how she feels after.  If she is not feeling less sleepy after discontinuing Accutane  she will try changing her Prozac  20 mg to an every other day dosing to see if this helps.  #3 morning nausea and dizziness. Chronic, unknown etiology. Monitor. Signs/symptoms to call or return for were reviewed and pt expressed understanding.  An After Visit Summary was printed and given to the patient.  FOLLOW UP: Return in about 6 months (around 01/15/2025) for routine chronic illness f/u.  Signed:  Gerlene Hockey, MD           07/17/2024     [1] No Known Allergies

## 2024-07-24 ENCOUNTER — Ambulatory Visit: Admitting: Psychology

## 2025-01-15 ENCOUNTER — Ambulatory Visit: Admitting: Family Medicine
